# Patient Record
Sex: Female | Born: 1980 | Hispanic: No | Marital: Married | Smoking: Never smoker
Health system: Southern US, Community
[De-identification: ages and names within clinical notes are randomized; demographics above are authoritative.]

## PROBLEM LIST (undated history)

## (undated) HISTORY — PX: OTHER SURGICAL HISTORY: SHX169

---

## 2009-02-19 ENCOUNTER — Inpatient Hospital Stay (HOSPITAL_COMMUNITY): Admission: AD | Admit: 2009-02-19 | Discharge: 2009-02-25 | Payer: Self-pay | Admitting: Obstetrics & Gynecology

## 2009-02-19 ENCOUNTER — Ambulatory Visit: Payer: Self-pay | Admitting: Obstetrics and Gynecology

## 2009-02-19 HISTORY — PX: ABDOMINAL HYSTERECTOMY: SHX81

## 2009-02-19 LAB — HM PAP SMEAR: HM Pap smear: NORMAL

## 2009-02-20 ENCOUNTER — Ambulatory Visit: Payer: Self-pay | Admitting: Pulmonary Disease

## 2009-02-20 ENCOUNTER — Encounter: Payer: Self-pay | Admitting: Obstetrics

## 2009-02-21 ENCOUNTER — Encounter: Payer: Self-pay | Admitting: Internal Medicine

## 2009-02-23 ENCOUNTER — Other Ambulatory Visit: Payer: Self-pay | Admitting: Obstetrics

## 2009-02-24 ENCOUNTER — Other Ambulatory Visit: Payer: Self-pay | Admitting: Obstetrics & Gynecology

## 2009-11-02 ENCOUNTER — Ambulatory Visit: Payer: Self-pay | Admitting: Cardiovascular Disease

## 2009-11-02 DIAGNOSIS — R0602 Shortness of breath: Secondary | ICD-10-CM | POA: Insufficient documentation

## 2009-11-02 DIAGNOSIS — R002 Palpitations: Secondary | ICD-10-CM | POA: Insufficient documentation

## 2009-11-09 ENCOUNTER — Ambulatory Visit: Payer: Self-pay | Admitting: Cardiovascular Disease

## 2010-02-08 ENCOUNTER — Ambulatory Visit: Payer: Self-pay | Admitting: Internal Medicine

## 2010-03-21 NOTE — Letter (Signed)
Summary: PHI  PHI   Imported By: Harlon Flor 11/07/2009 11:51:25  _____________________________________________________________________  External Attachment:    Type:   Image     Comment:   External Document

## 2010-03-21 NOTE — Assessment & Plan Note (Signed)
Summary: NP6/AMD   Visit Type:  Initial Consult Primary Provider:  Dr. Ronna Polio  CC:  c/o decrease in exercise tolerance and palpitations..  History of Present Illness: 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  She reports that her symptoms have started since her delivery of their first child earlier in January. She had a very complicated delivery associated with hemorrhage, possible DIC, requiring transfer to [facility] with intubation and a short stay in the intensive care unit, hysterectomy. Since her recovery, she has had some shortness of breath. This has been noted by her husband when they exhibit themselves. When she climbs stairs, he has noticed this. She has started back at work and works for a full week at a time. She does not notice any symptoms of shortness of breath at work. She denies any lightheadedness, coughing, lower extremity edema.  She has noticed palpitations at times, typically when she is less active. She has not noticed them at work. She feels that when she is playing with the baby. They are not fast, typically she has a regular rate. She is concerned about other arrhythmia though denies any tachycardia palpitations.  EKG shows normal sinus rhythm with rate of 77 beats per minute, no significant ST or T wave changes.  Preventive Screening-Counseling & Management  Alcohol-Tobacco     Smoking Status: never  Caffeine-Diet-Exercise     Does Patient Exercise: no      Drug Use:  no.    Current Medications (verified): 1)  Vitamin 3 50,000 Units .... Once A Week  Allergies (verified): No Known Drug Allergies  Past History:  Family History: Last updated: 11/02/2009 Family History of CVA or Stroke:  Family History of Cancer:  Family History of Hypertension:   Social History: Last updated: 11/02/2009 Married    Full Time Tobacco Use - No.  Alcohol Use - no Regular Exercise - no Drug Use - no  Risk Factors: Exercise: no (11/02/2009)  Risk Factors: Smoking Status: never (11/02/2009)  Past Medical History: hysterectomy; 2011 emergent hysterectomy after c-section complicated hemorrhage, DIC, required intubation and ICU.  Past Surgical History: hysterectomy; 2011 emergent hysterectomy after c-section complicated hemorrhage, DIC, required intubation and ICU.  Family History: Family History of CVA or Stroke:  Family History of Cancer:  Family History of Hypertension:   Social History: Married  Full Time Tobacco Use - No.  Alcohol Use - no Regular Exercise - no Drug Use - no Smoking Status:  never Does Patient Exercise:  no Drug Use:  no  Review of Systems       The patient complains of dyspnea on exertion.  The patient denies fever, weight loss, weight gain, vision loss, decreased hearing, hoarseness, chest pain, syncope, peripheral edema, prolonged cough, abdominal pain, incontinence, muscle weakness, depression, and enlarged lymph nodes.         palpitations  Vital Signs:  Patient profile:   30 year old female Height:      64 inches Weight:      109 pounds BMI:     18.78 Pulse rate:   77 / minute BP sitting:   100 / 62  (left arm) Cuff size:   regular  Vitals Entered By: Bishop Dublin, CMA (November 02, 2009 2:55 PM)  Physical Exam  General:  Well developed, well nourished, in no acute distress. Head:  normocephalic and atraumatic Neck:  Neck supple, no JVD. No masses, thyromegaly or abnormal cervical nodes. Lungs:  Clear bilaterally to auscultation and percussion. Heart:  Non-displaced PMI, chest non-tender; regular rate and rhythm, S1, S2 without murmurs, rubs or gallops. Carotid upstroke normal, no bruit.  Pedals normal pulses. No edema, no varicosities. Abdomen:   abdomen soft and non-tender without masses Msk:  Back normal, normal gait. Muscle strength and tone  normal. Pulses:  pulses normal in all 4 extremities Extremities:  No clubbing or cyanosis. Neurologic:  Alert and oriented x 3. Skin:  Intact without lesions or rashes. Psych:  Normal affect.   Impression & Recommendations:  Problem # 1:  SHORTNESS OF BREATH (ICD-786.05) etiology of her shortness of breath is uncertain. We will order an echocardiogram to rule out postpartum cardiomyopathy. She has no significant signs of heart failure as her lungs are clear and she has no edema.  If her ejection fraction is normal, I will suggest starting back on a regular exercise program. No medications would be needed.  She has mentioned having tachycardia with significant exercise and I have asked her to monitor her her heart rate with mild to moderate exercise. If it seems significantly elevated, we could have her wear a Holter monitor. Additionally if she has palpable tachycardia at rest associated with shortness of breath, had asked her to contact me for a Holter monitor or event monitor.  Orders: Echocardiogram (Echo)  Problem # 2:  PALPITATIONS (ICD-785.1) She does report palpitations though no irregularity to her heart beat.she does not feel that her heart rate is rapid. She is not want to take a beta blocker or other medication for her symptoms as it is not particularly bothersome. Again if she has worsening symptoms, we will order a monitor through a [facility].  Orders: Echocardiogram (Echo)

## 2010-05-07 LAB — CROSSMATCH
ABO/RH(D): O POS
ABO/RH(D): O POS
Antibody Screen: NEGATIVE
Antibody Screen: NEGATIVE

## 2010-05-07 LAB — DIC (DISSEMINATED INTRAVASCULAR COAGULATION)PANEL
D-Dimer, Quant: 3.21 ug/mL-FEU — ABNORMAL HIGH (ref 0.00–0.48)
Fibrinogen: 163 mg/dL — ABNORMAL LOW (ref 204–475)
Fibrinogen: 264 mg/dL (ref 204–475)
Fibrinogen: 319 mg/dL (ref 204–475)
INR: 1.3 (ref 0.00–1.49)
Platelets: 107 10*3/uL — ABNORMAL LOW (ref 150–400)
Platelets: 22 10*3/uL — CL (ref 150–400)
Platelets: 60 10*3/uL — ABNORMAL LOW (ref 150–400)
Platelets: 66 10*3/uL — ABNORMAL LOW (ref 150–400)
Prothrombin Time: 17.6 seconds — ABNORMAL HIGH (ref 11.6–15.2)
Prothrombin Time: 15 seconds (ref 11.6–15.2)
Smear Review: NONE SEEN
Smear Review: NONE SEEN
Smear Review: NONE SEEN
aPTT: 32 seconds (ref 24–37)
aPTT: 35 seconds (ref 24–37)

## 2010-05-07 LAB — CBC
HCT: 29.4 % — ABNORMAL LOW (ref 36.0–46.0)
HCT: 30.6 % — ABNORMAL LOW (ref 36.0–46.0)
HCT: 34.2 % — ABNORMAL LOW (ref 36.0–46.0)
HCT: 34.8 % — ABNORMAL LOW (ref 36.0–46.0)
Hemoglobin: 10.2 g/dL — ABNORMAL LOW (ref 12.0–15.0)
Hemoglobin: 6 g/dL — CL (ref 12.0–15.0)
Hemoglobin: 7.6 g/dL — ABNORMAL LOW (ref 12.0–15.0)
Hemoglobin: 10.3 g/dL — ABNORMAL LOW (ref 12.0–15.0)
Hemoglobin: 10.6 g/dL — ABNORMAL LOW (ref 12.0–15.0)
Hemoglobin: 11.6 g/dL — ABNORMAL LOW (ref 12.0–15.0)
Hemoglobin: 12.1 g/dL (ref 12.0–15.0)
MCHC: 33.1 g/dL (ref 30.0–36.0)
MCHC: 34.8 g/dL (ref 30.0–36.0)
MCHC: 34.9 g/dL (ref 30.0–36.0)
MCHC: 35.2 g/dL (ref 30.0–36.0)
MCHC: 35.5 g/dL (ref 30.0–36.0)
MCHC: 35.5 g/dL (ref 30.0–36.0)
MCV: 87.6 fL (ref 78.0–100.0)
MCV: 87.9 fL (ref 78.0–100.0)
Platelets: 90 10*3/uL — ABNORMAL LOW (ref 150–400)
Platelets: 22 10*3/uL — CL (ref 150–400)
Platelets: 73 10*3/uL — ABNORMAL LOW (ref 150–400)
Platelets: 80 10*3/uL — ABNORMAL LOW (ref 150–400)
Platelets: 81 10*3/uL — ABNORMAL LOW (ref 150–400)
RBC: 2.57 MIL/uL — ABNORMAL LOW (ref 3.87–5.11)
RBC: 3.22 MIL/uL — ABNORMAL LOW (ref 3.87–5.11)
RBC: 3.32 MIL/uL — ABNORMAL LOW (ref 3.87–5.11)
RBC: 3.41 MIL/uL — ABNORMAL LOW (ref 3.87–5.11)
RBC: 3.8 MIL/uL — ABNORMAL LOW (ref 3.87–5.11)
RBC: 3.97 MIL/uL (ref 3.87–5.11)
RDW: 14.1 % (ref 11.5–15.5)
RDW: 15.3 % (ref 11.5–15.5)
RDW: 15.7 % — ABNORMAL HIGH (ref 11.5–15.5)
RDW: 14.8 % (ref 11.5–15.5)
RDW: 14.9 % (ref 11.5–15.5)
RDW: 15 % (ref 11.5–15.5)
RDW: 15.2 % (ref 11.5–15.5)
RDW: 15.8 % — ABNORMAL HIGH (ref 11.5–15.5)
WBC: 16.6 10*3/uL — ABNORMAL HIGH (ref 4.0–10.5)
WBC: 9.2 10*3/uL (ref 4.0–10.5)
WBC: 7.5 10*3/uL (ref 4.0–10.5)
WBC: 7.9 10*3/uL (ref 4.0–10.5)
WBC: 9.5 10*3/uL (ref 4.0–10.5)

## 2010-05-07 LAB — COMPREHENSIVE METABOLIC PANEL
ALT: 20 U/L (ref 0–35)
AST: 29 U/L (ref 0–37)
Albumin: 1.9 g/dL — ABNORMAL LOW (ref 3.5–5.2)
Alkaline Phosphatase: 61 U/L (ref 39–117)
BUN: 3 mg/dL — ABNORMAL LOW (ref 6–23)
CO2: 25 mEq/L (ref 19–32)
CO2: 25 mEq/L (ref 19–32)
Calcium: 7.8 mg/dL — ABNORMAL LOW (ref 8.4–10.5)
Calcium: 8.2 mg/dL — ABNORMAL LOW (ref 8.4–10.5)
GFR calc Af Amer: >60 mL/min (ref >60)
GFR calc non Af Amer: >60 mL/min (ref >60)
Glucose, Bld: 87 mg/dL (ref 70–99)
Sodium: 139 mEq/L (ref 135–145)
Total Protein: 4.1 g/dL — ABNORMAL LOW (ref 6.0–8.3)
Total Protein: 4.6 g/dL — ABNORMAL LOW (ref 6.0–8.3)

## 2010-05-07 LAB — POCT I-STAT 3, ART BLOOD GAS (G3+)
Bicarbonate: 22.6 mEq/L (ref 20.0–24.0)
O2 Saturation: 99 %
Patient temperature: 98
Patient temperature: 99.8
TCO2: 23 mmol/L (ref 0–100)
pH, Arterial: 7.432 — ABNORMAL HIGH (ref 7.350–7.400)
pO2, Arterial: 193 mmHg — ABNORMAL HIGH (ref 80.0–100.0)

## 2010-05-07 LAB — PREPARE PLATELETS

## 2010-05-07 LAB — PREPARE FRESH FROZEN PLASMA

## 2010-05-07 LAB — POCT I-STAT 7, (LYTES, BLD GAS, ICA,H+H)
Calcium, Ion: 0.92 mmol/L — ABNORMAL LOW (ref 1.12–1.32)
O2 Saturation: 100 %
Potassium: 5.1 mEq/L (ref 3.5–5.1)
Sodium: 139 mEq/L (ref 135–145)
TCO2: 22 mmol/L (ref 0–100)
pCO2 arterial: 34.3 mmHg — ABNORMAL LOW (ref 35.0–45.0)

## 2010-05-07 LAB — BASIC METABOLIC PANEL
BUN: 3 mg/dL — ABNORMAL LOW (ref 6–23)
BUN: 3 mg/dL — ABNORMAL LOW (ref 6–23)
CO2: 23 mEq/L (ref 19–32)
CO2: 26 mEq/L (ref 19–32)
Calcium: 7.4 mg/dL — ABNORMAL LOW (ref 8.4–10.5)
Calcium: 7 mg/dL — ABNORMAL LOW (ref 8.4–10.5)
Calcium: 7.8 mg/dL — ABNORMAL LOW (ref 8.4–10.5)
Chloride: 105 mEq/L (ref 96–112)
Creatinine, Ser: 0.42 mg/dL (ref 0.4–1.2)
Creatinine, Ser: 0.55 mg/dL (ref 0.4–1.2)
GFR calc Af Amer: >60 mL/min (ref >60)
GFR calc Af Amer: >60 mL/min (ref >60)
GFR calc non Af Amer: >60 mL/min (ref >60)
GFR calc non Af Amer: >60 mL/min (ref >60)
GFR calc non Af Amer: >60 mL/min (ref >60)
Glucose, Bld: 117 mg/dL — ABNORMAL HIGH (ref 70–99)
Glucose, Bld: 111 mg/dL — ABNORMAL HIGH (ref 70–99)
Potassium: 3.5 mEq/L (ref 3.5–5.1)
Sodium: 136 mEq/L (ref 135–145)
Sodium: 138 mEq/L (ref 135–145)

## 2010-05-07 LAB — HEPATIC FUNCTION PANEL
ALT: 19 U/L (ref 0–35)
AST: 37 U/L (ref 0–37)
AST: 48 U/L — ABNORMAL HIGH (ref 0–37)
Albumin: 1.8 g/dL — ABNORMAL LOW (ref 3.5–5.2)
Albumin: 2 g/dL — ABNORMAL LOW (ref 3.5–5.2)
Alkaline Phosphatase: 40 U/L (ref 39–117)
Alkaline Phosphatase: 41 U/L (ref 39–117)
Total Bilirubin: 1.6 mg/dL — ABNORMAL HIGH (ref 0.3–1.2)
Total Protein: 4 g/dL — ABNORMAL LOW (ref 6.0–8.3)

## 2010-05-07 LAB — PROTIME-INR
INR: 1.23 (ref 0.00–1.49)
INR: 2.18 — ABNORMAL HIGH (ref 0.00–1.49)
Prothrombin Time: 15.4 seconds — ABNORMAL HIGH (ref 11.6–15.2)
Prothrombin Time: 24.1 seconds — ABNORMAL HIGH (ref 11.6–15.2)

## 2010-05-07 LAB — GLUCOSE, CAPILLARY
Glucose-Capillary: 71 mg/dL (ref 70–99)
Glucose-Capillary: 76 mg/dL (ref 70–99)

## 2010-05-07 LAB — MAGNESIUM: Magnesium: 2 mg/dL (ref 1.5–2.5)

## 2010-05-07 LAB — BRAIN NATRIURETIC PEPTIDE: Pro B Natriuretic peptide (BNP): 124 pg/mL — ABNORMAL HIGH (ref 0.0–100.0)

## 2010-05-07 LAB — APTT: aPTT: 28 seconds (ref 24–37)

## 2010-05-07 LAB — FIBRINOGEN
Fibrinogen: 323 mg/dL (ref 204–475)
Fibrinogen: <60 mg/dL — CL (ref 204–475)

## 2010-09-05 ENCOUNTER — Encounter: Payer: Self-pay | Admitting: Cardiovascular Disease

## 2011-05-15 ENCOUNTER — Ambulatory Visit (INDEPENDENT_AMBULATORY_CARE_PROVIDER_SITE_OTHER): Payer: 59 | Admitting: Internal Medicine

## 2011-05-15 ENCOUNTER — Encounter: Payer: Self-pay | Admitting: Internal Medicine

## 2011-05-15 VITALS — BP 100/62 | HR 102 | Temp 98.1°F | Ht 63.5 in | Wt 107.0 lb

## 2011-05-15 DIAGNOSIS — Z0000 Encounter for general adult medical examination without abnormal findings: Secondary | ICD-10-CM

## 2011-05-15 NOTE — Progress Notes (Signed)
  Subjective:    Patient ID: Emma Bates, female    DOB: [birth year], 31 y.o.   MRN: [id number]  HPI 30YO female presents for annual physical exam. Doing well. No complaints today. Follows healthy diet. 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 Husband works from home.  No outpatient encounter prescriptions on file as of 05/15/2011.    Review of Systems  Constitutional: Negative for fever, chills, appetite change, fatigue and unexpected weight change.  HENT: Negative for ear pain, congestion, sore throat, trouble swallowing, neck pain, voice change and sinus pressure.   Eyes: Negative for visual disturbance.  Respiratory: Negative for cough, shortness of breath, wheezing and stridor.   Cardiovascular: Negative for chest pain, palpitations and leg swelling.  Gastrointestinal: Negative for nausea, vomiting, abdominal pain, diarrhea, constipation, blood in stool, abdominal distention and anal bleeding.  Genitourinary: Negative for dysuria and flank pain.  Musculoskeletal: Negative for myalgias, arthralgias and gait problem.  Skin: Negative for color change and rash.  Neurological: Negative for dizziness and headaches.  Hematological: Negative for adenopathy. Does not bruise/bleed easily.  Psychiatric/Behavioral: Negative for suicidal ideas, sleep disturbance and dysphoric mood. The patient is not nervous/anxious.    BP 100/62  Pulse 102  Temp(Src) 98.1 F (36.7 C) (Oral)  Ht 5' 3.5" (1.613 m)  Wt 107 lb (48.535 kg)  BMI 18.66 kg/m2  SpO2 100%     Objective:   Physical Exam  Constitutional: She is oriented to person, place, and time. She appears well-developed and well-nourished. No distress.  HENT:  Head: Normocephalic and atraumatic.  Right Ear: External ear normal.  Left Ear: External ear normal.  Nose: Nose normal.  Mouth/Throat: Oropharynx is clear and moist. No oropharyngeal exudate.  Eyes: Conjunctivae are normal. Pupils are equal, round, and reactive to light.  Right eye exhibits no discharge. Left eye exhibits no discharge. No scleral icterus.  Neck: Normal range of motion. Neck supple. No tracheal deviation present. No thyromegaly present.  Cardiovascular: Normal rate, regular rhythm, normal heart sounds and intact distal pulses.  Exam reveals no gallop and no friction rub.   No murmur heard. Pulmonary/Chest: Effort normal and breath sounds normal. No respiratory distress. She has no wheezes. She has no rales. She exhibits no tenderness.  Abdominal: Soft. Bowel sounds are normal. She exhibits no distension. There is no tenderness.  Musculoskeletal: Normal range of motion. She exhibits no edema and no tenderness.  Lymphadenopathy:    She has no cervical adenopathy.  Neurological: She is alert and oriented to person, place, and time. No cranial nerve deficit. She exhibits normal muscle tone. Coordination normal.  Skin: Skin is warm and dry. No rash noted. She is not diaphoretic. No erythema. No pallor.  Psychiatric: She has a normal mood and affect. Her behavior is normal. Judgment and thought content normal.          Assessment & Plan:

## 2011-05-15 NOTE — Assessment & Plan Note (Signed)
Doing very well. Normal BMI. Active, follows healthy diet. PAP deferred as s/p complete hysterectomy with removal of cervix.  Immunizations UTD. Will check basic labs including CBC, CMP, lipids, Vit D, A1c. Follow up 1 year and prn.

## 2011-05-16 ENCOUNTER — Other Ambulatory Visit (INDEPENDENT_AMBULATORY_CARE_PROVIDER_SITE_OTHER): Payer: 59 | Admitting: *Deleted

## 2011-05-16 ENCOUNTER — Encounter: Payer: Self-pay | Admitting: Cardiovascular Disease

## 2011-05-16 DIAGNOSIS — Z0000 Encounter for general adult medical examination without abnormal findings: Secondary | ICD-10-CM

## 2011-05-16 LAB — CBC WITH DIFFERENTIAL/PLATELET
Basophils Relative: 0.3 % (ref 0.0–3.0)
Eosinophils Absolute: 0 10*3/uL (ref 0.0–0.7)
Eosinophils Relative: 0.7 % (ref 0.0–5.0)
Hemoglobin: 13.7 g/dL (ref 12.0–15.0)
Lymphocytes Relative: 29.3 % (ref 12.0–46.0)
MCHC: 33.6 g/dL (ref 30.0–36.0)
Monocytes Relative: 7.8 % (ref 3.0–12.0)
Neutro Abs: 2.9 10*3/uL (ref 1.4–7.7)
Neutrophils Relative %: 61.9 % (ref 43.0–77.0)
RBC: 4.73 Mil/uL (ref 3.87–5.11)
WBC: 4.6 10*3/uL (ref 4.5–10.5)

## 2011-05-16 LAB — COMPREHENSIVE METABOLIC PANEL
AST: 19 U/L (ref 0–37)
Albumin: 4.1 g/dL (ref 3.5–5.2)
BUN: 10 mg/dL (ref 6–23)
CO2: 29 mEq/L (ref 19–32)
Calcium: 9.2 mg/dL (ref 8.4–10.5)
Chloride: 103 mEq/L (ref 96–112)
Creatinine, Ser: 0.5 mg/dL (ref 0.4–1.2)
GFR: 152.19 mL/min (ref >60.00)
Glucose, Bld: 91 mg/dL (ref 70–99)
Potassium: 4.1 mEq/L (ref 3.5–5.1)

## 2011-05-16 LAB — LIPID PANEL
Cholesterol: 156 mg/dL (ref 0–200)
LDL Cholesterol: 84 mg/dL (ref 0–99)
Triglycerides: 46 mg/dL (ref 0.0–149.0)

## 2011-05-17 ENCOUNTER — Encounter: Payer: Self-pay | Admitting: Internal Medicine

## 2011-05-17 LAB — VITAMIN D 25 HYDROXY (VIT D DEFICIENCY, FRACTURES): Vit D, 25-Hydroxy: 26 ng/mL — ABNORMAL LOW (ref 30–89)

## 2011-10-01 ENCOUNTER — Other Ambulatory Visit: Payer: Self-pay | Admitting: Internal Medicine

## 2011-10-01 MED ORDER — POLYMYXIN B-TRIMETHOPRIM 10000-0.1 UNIT/ML-% OP SOLN: 1.0000 [drp] | OPHTHALMIC | Status: AC

## 2011-10-29 ENCOUNTER — Encounter: Payer: Self-pay | Admitting: Internal Medicine

## 2011-11-14 ENCOUNTER — Encounter: Payer: Self-pay | Admitting: Internal Medicine

## 2011-11-14 ENCOUNTER — Ambulatory Visit (INDEPENDENT_AMBULATORY_CARE_PROVIDER_SITE_OTHER): Payer: 59 | Admitting: Internal Medicine

## 2011-11-14 VITALS — BP 104/68 | HR 68 | Resp 14 | Wt 110.5 lb

## 2011-11-14 DIAGNOSIS — Z139 Encounter for screening, unspecified: Secondary | ICD-10-CM

## 2011-11-14 DIAGNOSIS — Z13220 Encounter for screening for lipoid disorders: Secondary | ICD-10-CM

## 2011-11-14 LAB — HEMOGLOBIN A1C: Hgb A1c MFr Bld: 5.3 % (ref 4.6–6.5)

## 2011-11-14 NOTE — Patient Instructions (Addendum)
It was nice meeting you today.  We will complete the work form once the lab results are complete.  We will notify you of your lab results.

## 2011-11-14 NOTE — Assessment & Plan Note (Signed)
Here to have a work form completed.  Required labs ordered - LDL, fasting glucose and a1c.  Once lab results return, will complete form and notify pt.

## 2011-11-14 NOTE — Progress Notes (Signed)
  Subjective:    Patient ID: Emma Bates, female    DOB: March 24, 1980, 31 y.o.   MRN: [id number]  HPI  31 year old female who presents to have a work form completed.   Outpatient Prescriptions Prior to Visit  Medication Sig Dispense Refill  . Cholecalciferol (VITAMIN D3) 50000 UNITS CAPS Take by mouth once a week.          Review of Systems  HENT: Negative for congestion.   Respiratory: Negative for cough, chest tightness and shortness of breath.   Cardiovascular: Negative for chest pain.  Gastrointestinal: Negative for nausea, vomiting, abdominal pain, diarrhea and constipation.  Neurological: Negative for weakness.       Objective:   Physical Exam  Constitutional: She appears well-developed and well-nourished. No distress.  HENT:  Nose: Nose normal.  Mouth/Throat: Oropharynx is clear and moist.       TMs visualized - without erythema.    Neck: Neck supple.       nontender  Cardiovascular: Normal rate, regular rhythm and normal heart sounds.   Pulmonary/Chest: Breath sounds normal. No respiratory distress. She has no wheezes.  Abdominal: Soft. Bowel sounds are normal. There is no tenderness.  Musculoskeletal: She exhibits no edema.       No pain with straight leg raise.   Lymphadenopathy:    She has no cervical adenopathy.          Assessment & Plan:

## 2011-11-15 ENCOUNTER — Telehealth: Payer: Self-pay | Admitting: Internal Medicine

## 2011-11-15 NOTE — Telephone Encounter (Signed)
Patient advised as instructed via telephone, form faxed to [phone].

## 2011-11-15 NOTE — Telephone Encounter (Signed)
Labs ok.  Form completed

## 2011-12-24 ENCOUNTER — Telehealth: Payer: Self-pay | Admitting: Internal Medicine

## 2011-12-24 NOTE — Telephone Encounter (Signed)
Pt called Wanting to know why her labs were not on my chart

## 2011-12-25 NOTE — Telephone Encounter (Signed)
Patient's lab results released in my chart.

## 2011-12-27 ENCOUNTER — Encounter: Payer: Self-pay | Admitting: *Deleted

## 2012-04-05 ENCOUNTER — Other Ambulatory Visit: Payer: Self-pay

## 2012-10-13 ENCOUNTER — Encounter: Payer: Self-pay | Admitting: Adult Health

## 2012-10-13 ENCOUNTER — Ambulatory Visit (INDEPENDENT_AMBULATORY_CARE_PROVIDER_SITE_OTHER): Payer: 59 | Admitting: Adult Health

## 2012-10-13 VITALS — BP 96/72 | HR 76 | Temp 98.0°F | Resp 12 | Ht 64.0 in | Wt 109.0 lb

## 2012-10-13 DIAGNOSIS — Z0000 Encounter for general adult medical examination without abnormal findings: Secondary | ICD-10-CM

## 2012-10-13 DIAGNOSIS — E559 Vitamin D deficiency, unspecified: Secondary | ICD-10-CM

## 2012-10-13 NOTE — Progress Notes (Signed)
Subjective:    Patient ID: Emma Bates, female    DOB: 1981-02-05, 32 y.o.   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 She is doing very well. She works as a Licensed conveyancer at [employer].   PMHx: No past medical hx.   Past Surgical History  Procedure Laterality Date  . Hysterectomy (other)      2011 emergent hysterectomy afte rc-sectio complicated hemorrhage   . Dic      required intubation an dICU  . Abdominal hysterectomy  2011  . Cesarean section  2011    Family History  Problem Relation Age of Onset  . Cancer Other   . Stroke Other   . Hypertension Other   . Diabetes Mother   . Thyroid disease Father   . Cancer Maternal Grandmother     Ovarian - 60's  . Hypertension Maternal Grandmother   . Stroke Paternal Grandfather   . Hypertension Paternal Grandfather     History   Social History  . Marital Status: Married    Spouse Name: N/A    Number of Children: 1  . Years of Education: N/A   Occupational History  . MD     Social History Main Topics  . Smoking status: Never Smoker   . Smokeless tobacco: Never Used     Comment: tobacco use- no   . Alcohol Use: No  . Drug Use: No  . Sexual Activity:    Other Topics Concern  . Not on file   Social History Narrative   ** Merged History Encounter **       ** Data from: 09/05/10 Enc Dept: [facility]   Full time. Does not regularly exercise.        ** Data from: 05/15/11 Enc Dept: [facility]   Regular Exercise -  GYM, treadmill or elliptical 2 times a week x 30 min   Daily Caffeine Use:  Occasional soda           Review of Systems  Constitutional: Negative.   HENT: Negative.   Eyes: Negative.   Respiratory: Negative.   Cardiovascular: Negative.   Gastrointestinal: Negative.   Endocrine: Negative.   Genitourinary: Negative.   Musculoskeletal: Negative.   Skin: Negative.   Allergic/Immunologic: Negative.    Neurological: Negative.   Hematological: Negative.   Psychiatric/Behavioral: Negative.     BP 96/72  Pulse 76  Temp(Src) 98 F (36.7 C) (Oral)  Resp 12  Ht 5\' 4"  (1.626 m)  Wt 109 lb (49.442 kg)  BMI 18.7 kg/m2  SpO2 99%    Objective:   Physical Exam  Constitutional: She is oriented to person, place, and time. She appears well-developed and well-nourished.  HENT:  Head: Normocephalic and atraumatic.  Right Ear: External ear normal.  Left Ear: External ear normal.  Nose: Nose normal.  Mouth/Throat: Oropharynx is clear and moist.  Eyes: Conjunctivae and EOM are normal. Pupils are equal, round, and reactive to light.  Neck: Normal range of motion. Neck supple. No tracheal deviation present. No thyromegaly present.  Cardiovascular: Normal rate, regular rhythm, normal heart sounds and intact distal pulses.  Exam reveals no gallop and no friction rub.   No murmur heard. Pulmonary/Chest: Effort normal and breath sounds normal. No respiratory distress. She has no wheezes. She has no rales.  Abdominal: Soft. Bowel sounds are normal. She exhibits no distension and no mass. There is no tenderness. There is no rebound and no  guarding.  Genitourinary:  Pelvic deferred - s/p hysterectomy  Musculoskeletal: Normal range of motion. She exhibits no edema and no tenderness.  Lymphadenopathy:    She has no cervical adenopathy.  Neurological: She is alert and oriented to person, place, and time. She has normal reflexes. No cranial nerve deficit. Coordination normal.  Skin: Skin is warm and dry.  Psychiatric: She has a normal mood and affect. Her behavior is normal. Judgment and thought content normal.          Assessment & Plan:

## 2012-10-13 NOTE — Assessment & Plan Note (Signed)
Normal physical exam excluding pelvic as s/p complete hysterectomy with removal of cervix. Normal BMI. Immunizations up to date. Check labs: CBC, CMP, lipids, Vit D, A1c. She will return for fasting labs. Follow up 1 year and prn.

## 2012-10-14 ENCOUNTER — Other Ambulatory Visit (INDEPENDENT_AMBULATORY_CARE_PROVIDER_SITE_OTHER): Payer: 59

## 2012-10-14 DIAGNOSIS — Z0000 Encounter for general adult medical examination without abnormal findings: Secondary | ICD-10-CM

## 2012-10-14 DIAGNOSIS — E559 Vitamin D deficiency, unspecified: Secondary | ICD-10-CM

## 2012-10-14 LAB — CBC WITH DIFFERENTIAL/PLATELET
Eosinophils Relative: 0.9 % (ref 0.0–5.0)
HCT: 37.5 % (ref 36.0–46.0)
Hemoglobin: 12.8 g/dL (ref 12.0–15.0)
Lymphs Abs: 1.1 10*3/uL (ref 0.7–4.0)
Monocytes Relative: 8.8 % (ref 3.0–12.0)
Neutro Abs: 3 10*3/uL (ref 1.4–7.7)
RBC: 4.4 Mil/uL (ref 3.87–5.11)
WBC: 4.5 10*3/uL (ref 4.5–10.5)

## 2012-10-14 LAB — COMPREHENSIVE METABOLIC PANEL
Albumin: 3.9 g/dL (ref 3.5–5.2)
BUN: 9 mg/dL (ref 6–23)
Calcium: 9 mg/dL (ref 8.4–10.5)
Chloride: 106 mEq/L (ref 96–112)
Glucose, Bld: 88 mg/dL (ref 70–99)
Potassium: 4 mEq/L (ref 3.5–5.1)
Sodium: 136 mEq/L (ref 135–145)
Total Protein: 6.9 g/dL (ref 6.0–8.3)

## 2012-10-14 LAB — LIPID PANEL
Cholesterol: 138 mg/dL (ref 0–200)
Triglycerides: 60 mg/dL (ref 0.0–149.0)

## 2012-10-15 ENCOUNTER — Encounter: Payer: Self-pay | Admitting: Adult Health

## 2012-10-15 ENCOUNTER — Other Ambulatory Visit: Payer: Self-pay | Admitting: Adult Health

## 2012-10-15 LAB — VITAMIN D 25 HYDROXY (VIT D DEFICIENCY, FRACTURES): Vit D, 25-Hydroxy: 18 ng/mL — ABNORMAL LOW (ref 30–89)

## 2012-10-15 MED ORDER — ERGOCALCIFEROL 1.25 MG (50000 UT) PO CAPS: 50000.0000 [IU] | ORAL_CAPSULE | ORAL | Status: DC

## 2012-12-25 ENCOUNTER — Other Ambulatory Visit: Payer: Self-pay

## 2013-01-05 ENCOUNTER — Other Ambulatory Visit: Payer: Self-pay | Admitting: Adult Health

## 2013-01-05 NOTE — Telephone Encounter (Signed)
Sent myChart message, notifying pt of need for repeat Vitamin D level.

## 2013-11-12 ENCOUNTER — Encounter: Payer: Self-pay | Admitting: Internal Medicine

## 2013-11-12 ENCOUNTER — Ambulatory Visit (INDEPENDENT_AMBULATORY_CARE_PROVIDER_SITE_OTHER): Payer: 59 | Admitting: Internal Medicine

## 2013-11-12 VITALS — BP 98/62 | HR 63 | Temp 98.0°F | Ht 63.75 in | Wt 106.0 lb

## 2013-11-12 DIAGNOSIS — Z0000 Encounter for general adult medical examination without abnormal findings: Secondary | ICD-10-CM

## 2013-11-12 LAB — COMPREHENSIVE METABOLIC PANEL
ALT: 39 U/L — ABNORMAL HIGH (ref 0–35)
AST: 42 U/L — AB (ref 0–37)
Albumin: 4.5 g/dL (ref 3.5–5.2)
Alkaline Phosphatase: 68 U/L (ref 39–117)
BUN: 12 mg/dL (ref 6–23)
CALCIUM: 9.4 mg/dL (ref 8.4–10.5)
CHLORIDE: 103 meq/L (ref 96–112)
CO2: 28 meq/L (ref 19–32)
CREATININE: 0.6 mg/dL (ref 0.4–1.2)
GFR: 116.92 mL/min (ref >60.00)
Glucose, Bld: 95 mg/dL (ref 70–99)
Potassium: 5.1 mEq/L (ref 3.5–5.1)
Sodium: 138 mEq/L (ref 135–145)
Total Bilirubin: 0.6 mg/dL (ref 0.2–1.2)
Total Protein: 7 g/dL (ref 6.0–8.3)

## 2013-11-12 LAB — CBC WITH DIFFERENTIAL/PLATELET
BASOS PCT: 0.4 % (ref 0.0–3.0)
Basophils Absolute: 0 10*3/uL (ref 0.0–0.1)
EOS ABS: 0 10*3/uL (ref 0.0–0.7)
Eosinophils Relative: 0.9 % (ref 0.0–5.0)
HEMATOCRIT: 41.2 % (ref 36.0–46.0)
HEMOGLOBIN: 13.9 g/dL (ref 12.0–15.0)
LYMPHS PCT: 30.6 % (ref 12.0–46.0)
Lymphs Abs: 1.5 10*3/uL (ref 0.7–4.0)
MCHC: 33.8 g/dL (ref 30.0–36.0)
MCV: 84.8 fl (ref 78.0–100.0)
Monocytes Absolute: 0.3 10*3/uL (ref 0.1–1.0)
Monocytes Relative: 6.4 % (ref 3.0–12.0)
NEUTROS ABS: 3 10*3/uL (ref 1.4–7.7)
Neutrophils Relative %: 61.7 % (ref 43.0–77.0)
Platelets: 143 10*3/uL — ABNORMAL LOW (ref 150.0–400.0)
RBC: 4.86 Mil/uL (ref 3.87–5.11)
RDW: 13 % (ref 11.5–15.5)
WBC: 4.8 10*3/uL (ref 4.0–10.5)

## 2013-11-12 LAB — LIPID PANEL
Cholesterol: 167 mg/dL (ref 0–200)
HDL: 65.6 mg/dL (ref >39.00)
LDL Cholesterol: 89 mg/dL (ref 0–99)
NonHDL: 101.4
TRIGLYCERIDES: 63 mg/dL (ref 0.0–149.0)
Total CHOL/HDL Ratio: 3
VLDL: 12.6 mg/dL (ref 0.0–40.0)

## 2013-11-12 LAB — VITAMIN B12: VITAMIN B 12: 279 pg/mL (ref 211–911)

## 2013-11-12 LAB — HEMOGLOBIN A1C: HEMOGLOBIN A1C: 5.4 % (ref 4.6–6.5)

## 2013-11-12 LAB — MICROALBUMIN / CREATININE URINE RATIO
Creatinine,U: 197.2 mg/dL
MICROALB/CREAT RATIO: 0.6 mg/g (ref 0.0–30.0)
Microalb, Ur: 1.1 mg/dL (ref 0.0–1.9)

## 2013-11-12 LAB — VITAMIN D 25 HYDROXY (VIT D DEFICIENCY, FRACTURES): VITD: 20.21 ng/mL — AB (ref 30.00–100.00)

## 2013-11-12 LAB — TSH: TSH: 1.58 u[IU]/mL (ref 0.35–4.50)

## 2013-11-12 NOTE — Assessment & Plan Note (Signed)
General medical exam including breast exam normal today. PAP and pelvic deferred as pt s/p complete hysterectomy. Immunizations are UTD. Labs today including CBC, CMP, lipids, A1c, TSH, Vit D. Encouraged healthy diet and exercise.

## 2013-11-12 NOTE — Patient Instructions (Signed)

## 2013-11-12 NOTE — Progress Notes (Signed)
Subjective:    Patient ID: Emma Bates, female    DOB: 05-01-1980, 33 y.o.   MRN: [id number]  HPI 33YO female presents for annual exam. Feeling well. No concerns today. Follows healthy, vegetarian diet. Physically active.   Review of Systems  Constitutional: Negative for fever, chills, appetite change, fatigue and unexpected weight change.  Eyes: Negative for visual disturbance.  Respiratory: Negative for shortness of breath.   Cardiovascular: Negative for chest pain and leg swelling.  Gastrointestinal: Negative for nausea, vomiting, abdominal pain, diarrhea and constipation.  Musculoskeletal: Negative for arthralgias and myalgias.  Skin: Negative for color change and rash.  Hematological: Negative for adenopathy. Does not bruise/bleed easily.  Psychiatric/Behavioral: Negative for dysphoric mood. The patient is not nervous/anxious.        Objective:    BP 98/62  Pulse 63  Temp(Src) 98 F (36.7 C) (Oral)  Ht 5' 3.75" (1.619 m)  Wt 106 lb (48.081 kg)  BMI 18.34 kg/m2  SpO2 99% Physical Exam  Constitutional: She is oriented to person, place, and time. She appears well-developed and well-nourished. No distress.  HENT:  Head: Normocephalic and atraumatic.  Right Ear: External ear normal.  Left Ear: External ear normal.  Nose: Nose normal.  Mouth/Throat: Oropharynx is clear and moist. No oropharyngeal exudate.  Eyes: Conjunctivae are normal. Pupils are equal, round, and reactive to light. Right eye exhibits no discharge. Left eye exhibits no discharge. No scleral icterus.  Neck: Normal range of motion. Neck supple. No tracheal deviation present. No thyromegaly present.  Cardiovascular: Normal rate, regular rhythm, normal heart sounds and intact distal pulses.  Exam reveals no gallop and no friction rub.   No murmur heard. Pulmonary/Chest: Effort normal and breath sounds normal. No accessory muscle usage. Not tachypneic. No respiratory distress. She has no decreased  breath sounds. She has no wheezes. She has no rhonchi. She has no rales. She exhibits no tenderness. Right breast exhibits no inverted nipple, no mass, no nipple discharge, no skin change and no tenderness. Left breast exhibits no inverted nipple, no mass, no nipple discharge, no skin change and no tenderness. Breasts are symmetrical.  Abdominal: Soft. Bowel sounds are normal. She exhibits no distension and no mass. There is no tenderness. There is no rebound and no guarding.  Musculoskeletal: Normal range of motion. She exhibits no edema and no tenderness.  Lymphadenopathy:    She has no cervical adenopathy.  Neurological: She is alert and oriented to person, place, and time. No cranial nerve deficit. She exhibits normal muscle tone. Coordination normal.  Skin: Skin is warm and dry. No rash noted. She is not diaphoretic. No erythema. No pallor.  Psychiatric: She has a normal mood and affect. Her behavior is normal. Judgment and thought content normal.          Assessment & Plan:   Problem List Items Addressed This Visit     Unprioritized   Routine general medical examination at a health care facility - Primary     General medical exam including breast exam normal today. PAP and pelvic deferred as pt s/p complete hysterectomy. Immunizations are UTD. Labs today including CBC, CMP, lipids, A1c, TSH, Vit D. Encouraged healthy diet and exercise.    Relevant Orders      CBC with Differential      Comprehensive metabolic panel      Lipid panel      Microalbumin / creatinine urine ratio      Vit D  25 hydroxy (rtn osteoporosis monitoring)  TSH      HgB A1c      B12       No Follow-up on file.

## 2013-11-19 ENCOUNTER — Other Ambulatory Visit (INDEPENDENT_AMBULATORY_CARE_PROVIDER_SITE_OTHER): Payer: 59

## 2013-11-19 ENCOUNTER — Telehealth: Payer: Self-pay | Admitting: *Deleted

## 2013-11-19 DIAGNOSIS — R7989 Other specified abnormal findings of blood chemistry: Secondary | ICD-10-CM

## 2013-11-19 LAB — COMPREHENSIVE METABOLIC PANEL
ALK PHOS: 55 U/L (ref 39–117)
ALT: 20 U/L (ref 0–35)
AST: 22 U/L (ref 0–37)
Albumin: 4 g/dL (ref 3.5–5.2)
BILIRUBIN TOTAL: 0.8 mg/dL (ref 0.2–1.2)
BUN: 9 mg/dL (ref 6–23)
CO2: 28 meq/L (ref 19–32)
CREATININE: 0.5 mg/dL (ref 0.4–1.2)
Calcium: 8.9 mg/dL (ref 8.4–10.5)
Chloride: 106 mEq/L (ref 96–112)
GFR: 157.08 mL/min (ref >60.00)
Glucose, Bld: 90 mg/dL (ref 70–99)
Potassium: 4.2 mEq/L (ref 3.5–5.1)
SODIUM: 137 meq/L (ref 135–145)
Total Protein: 6.5 g/dL (ref 6.0–8.3)

## 2013-11-19 NOTE — Telephone Encounter (Signed)
What labs and dx?  

## 2013-11-19 NOTE — Telephone Encounter (Signed)
CMP for elevated liver function tests.

## 2014-04-26 ENCOUNTER — Telehealth: Payer: Self-pay

## 2014-04-26 NOTE — Telephone Encounter (Signed)
The patient called back and has scheduled for tomorrow with C.Doss.

## 2014-04-26 NOTE — Telephone Encounter (Signed)
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  She was offered an appointment for tomorrow, but refused stating "she can usually be worked in same day".  Please advise if you want this patient on your schedule for today.

## 2014-04-26 NOTE — Telephone Encounter (Signed)
Please let her know I can see her this afternoon at 3:45pm or 4pm?

## 2014-04-27 ENCOUNTER — Encounter: Payer: Self-pay | Admitting: Nurse Practitioner

## 2014-04-27 ENCOUNTER — Ambulatory Visit (INDEPENDENT_AMBULATORY_CARE_PROVIDER_SITE_OTHER): Payer: 59 | Admitting: Nurse Practitioner

## 2014-04-27 VITALS — BP 98/64 | HR 68 | Temp 97.8°F | Resp 12 | Ht 63.75 in | Wt 110.0 lb

## 2014-04-27 DIAGNOSIS — J069 Acute upper respiratory infection, unspecified: Secondary | ICD-10-CM | POA: Insufficient documentation

## 2014-04-27 DIAGNOSIS — R509 Fever, unspecified: Secondary | ICD-10-CM

## 2014-04-27 DIAGNOSIS — B9789 Other viral agents as the cause of diseases classified elsewhere: Secondary | ICD-10-CM

## 2014-04-27 LAB — POCT INFLUENZA A/B
Influenza A, POC: NEGATIVE
Influenza B, POC: NEGATIVE

## 2014-04-27 NOTE — Progress Notes (Signed)
   Subjective:    Patient ID: Emma Bates, female    DOB: 19-Dec-1980, 34 y.o.   MRN: [id number]  HPI  Emma Bates is a 34 yo female with a CC of cough and sore throat with fever.  1) Non productive cough, sore throat, midnight 101.8, chills, sweats, 1 week ago nauseated and vomiting. 100.8 yesterday morning. Motrin/Tylenol- helpful  Daughter woke up this morning with a fever of 103. She would like to rule out influenza.     Review of Systems  Constitutional: Positive for fever, chills, diaphoresis and fatigue.  Respiratory: Positive for cough. Negative for chest tightness, shortness of breath and wheezing.   Cardiovascular: Negative for chest pain, palpitations and leg swelling.  Gastrointestinal: Positive for nausea and vomiting. Negative for diarrhea.       Last week N/V currently resolved.   Skin: Negative for rash.  Neurological: Negative for dizziness, weakness, numbness and headaches.  Psychiatric/Behavioral: The patient is not nervous/anxious.        Objective:   Physical Exam  Constitutional: She is oriented to person, place, and time. She appears well-developed and well-nourished. No distress.  BP 98/64 mmHg  Pulse 68  Temp(Src) 97.8 F (36.6 C) (Oral)  Resp 12  Ht 5' 3.75" (1.619 m)  Wt 110 lb (49.896 kg)  BMI 19.04 kg/m2  SpO2 99%   HENT:  Head: Normocephalic and atraumatic.  Right Ear: External ear normal.  Left Ear: External ear normal.  Eyes: EOM are normal. Pupils are equal, round, and reactive to light. Right eye exhibits no discharge. Left eye exhibits no discharge. No scleral icterus.  Neck: Normal range of motion. Neck supple. No thyromegaly present.  Cardiovascular: Normal rate, regular rhythm, normal heart sounds and intact distal pulses.  Exam reveals no gallop and no friction rub.   No murmur heard. Pulmonary/Chest: Effort normal and breath sounds normal. No respiratory distress. She has no wheezes. She has no rales. She exhibits no  tenderness.  Lymphadenopathy:    She has no cervical adenopathy.  Neurological: She is alert and oriented to person, place, and time. No cranial nerve deficit. She exhibits normal muscle tone. Coordination normal.  Skin: Skin is warm and dry. No rash noted. She is not diaphoretic.  Psychiatric: She has a normal mood and affect. Her behavior is normal. Judgment and thought content normal.      Assessment & Plan:

## 2014-04-27 NOTE — Patient Instructions (Signed)
Let me know if you need anything!     Viral Infections A virus is a type of germ. Viruses can cause:  Minor sore throats.  Aches and pains.  Headaches.  Runny nose.  Rashes.  Watery eyes.  Tiredness.  Coughs.  Loss of appetite.  Feeling sick to your stomach (nausea).  Throwing up (vomiting).  Watery poop (diarrhea). HOME CARE   Only take medicines as told by your doctor.  Drink enough water and fluids to keep your pee (urine) clear or pale yellow. Sports drinks are a good choice.  Get plenty of rest and eat healthy. Soups and broths with crackers or rice are fine. GET HELP RIGHT AWAY IF:   You have a very bad headache.  You have shortness of breath.  You have chest pain or neck pain.  You have an unusual rash.  You cannot stop throwing up.  You have watery poop that does not stop.  You cannot keep fluids down.  You or your child has a temperature by mouth above 102 F (38.9 C), not controlled by medicine.  Your baby is older than 3 months with a rectal temperature of 102 F (38.9 C) or higher.  Your baby is 513 months old or younger with a rectal temperature of 100.4 F (38 C) or higher. MAKE SURE YOU:   Understand these instructions.  Will watch this condition.  Will get help right away if you are not doing well or get worse. Document Released: 01/19/2008 Document Revised: 04/30/2011 Document Reviewed: 06/13/2010 [facility] Patient Information 2015 [employer] [place]. This information is not intended to replace advice given to you by your health care provider. Make sure you discuss any questions you have with your health care provider.

## 2014-04-27 NOTE — Assessment & Plan Note (Signed)
Improved. Pt would like to be tested for flu in case her daughter has been exposed and could start tx. POCT influenza A/B is negative. Will continue OTC treatment and call us if at day 10 she is not improving.

## 2014-04-27 NOTE — Progress Notes (Signed)
Pre visit review using our clinic review tool, if applicable. No additional management support is needed unless otherwise documented below in the visit note. 

## 2014-10-19 ENCOUNTER — Encounter: Payer: 59 | Admitting: Internal Medicine

## 2014-11-01 ENCOUNTER — Encounter: Payer: Self-pay | Admitting: Internal Medicine

## 2014-11-01 ENCOUNTER — Ambulatory Visit (INDEPENDENT_AMBULATORY_CARE_PROVIDER_SITE_OTHER): Payer: 59 | Admitting: Internal Medicine

## 2014-11-01 VITALS — BP 101/69 | HR 70 | Temp 97.5°F | Ht 63.5 in | Wt 110.0 lb

## 2014-11-01 DIAGNOSIS — Z0000 Encounter for general adult medical examination without abnormal findings: Secondary | ICD-10-CM

## 2014-11-01 DIAGNOSIS — Z23 Encounter for immunization: Secondary | ICD-10-CM

## 2014-11-01 LAB — HM PAP SMEAR

## 2014-11-01 NOTE — Progress Notes (Signed)
Subjective:    Patient ID: Emma Bates, female    DOB: 04/24/1980, 34 y.o.   MRN: [id number]  HPI  34YO female presents for physical exam.  Feeling well. No concerns today. Recently traveled to [place] to visit family. Continues to work as hospitalist.   BP Readings from Last 3 Encounters:  11/01/14 101/69  04/27/14 98/64  11/12/13 98/62   Wt Readings from Last 3 Encounters:  11/01/14 110 lb (49.896 kg)  04/27/14 110 lb (49.896 kg)  11/12/13 106 lb (48.081 kg)     History reviewed. No pertinent past medical history. Family History  Problem Relation Age of Onset  . Cancer Other   . Stroke Other   . Hypertension Other   . Diabetes Mother   . Hypertension Mother   . Hypertension Maternal Grandmother   . Stroke Paternal Grandfather   . Hypertension Paternal Grandfather   . Thyroid disease Sister   . Cancer Paternal Grandmother     ovarian    Past Surgical History  Procedure Laterality Date  . Hysterectomy (other)      2011 emergent hysterectomy afte rc-sectio complicated hemorrhage   . Dic      required intubation an dICU  . Abdominal hysterectomy  2011  . Cesarean section  2011   Social History   Social History  . Marital Status: Married    Spouse Name: N/A  . Number of Children: 1  . Years of Education: N/A   Occupational History  . MD     Social History Main Topics  . Smoking status: Never Smoker   . Smokeless tobacco: Never Used     Comment: tobacco use- no   . Alcohol Use: No  . Drug Use: No  . Sexual Activity: Not Asked   Other Topics Concern  . None   Social History Narrative   ** Merged History Encounter **       ** Data from: 09/05/10 Enc Dept: [facility]   Full time. Does not regularly exercise.        ** Data from: 05/15/11 Enc Dept: [facility]   Regular Exercise -  GYM, treadmill or elliptical 2 times a week x 30 min   Daily Caffeine Use:  Occasional soda          Review of Systems  Constitutional:  Negative for fever, chills, appetite change, fatigue and unexpected weight change.  Eyes: Negative for visual disturbance.  Respiratory: Negative for shortness of breath and wheezing.   Cardiovascular: Negative for chest pain and leg swelling.  Gastrointestinal: Negative for nausea, vomiting, abdominal pain, diarrhea and constipation.  Musculoskeletal: Negative for myalgias and arthralgias.  Skin: Negative for color change and rash.  Neurological: Negative for weakness.  Hematological: Negative for adenopathy. Does not bruise/bleed easily.  Psychiatric/Behavioral: Negative for suicidal ideas, sleep disturbance and dysphoric mood. The patient is not nervous/anxious.        Objective:    BP 101/69 mmHg  Pulse 70  Temp(Src) 97.5 F (36.4 C) (Oral)  Ht 5' 3.5" (1.613 m)  Wt 110 lb (49.896 kg)  BMI 19.18 kg/m2  SpO2 100% Physical Exam  Constitutional: She is oriented to person, place, and time. She appears well-developed and well-nourished. No distress.  HENT:  Head: Normocephalic and atraumatic.  Right Ear: External ear normal.  Left Ear: External ear normal.  Nose: Nose normal.  Mouth/Throat: Oropharynx is clear and moist. No oropharyngeal exudate.  Eyes: Conjunctivae and EOM are normal. Pupils are equal, round, and reactive to  light. Right eye exhibits no discharge.  Neck: Normal range of motion. Neck supple. No thyromegaly present.  Cardiovascular: Normal rate, regular rhythm, normal heart sounds and intact distal pulses.  Exam reveals no gallop and no friction rub.   No murmur heard. Pulmonary/Chest: Effort normal. No respiratory distress. She has no wheezes. She has no rales.  Abdominal: Soft. Bowel sounds are normal. She exhibits no distension and no mass. There is no tenderness. There is no rebound and no guarding.  Musculoskeletal: Normal range of motion. She exhibits no edema or tenderness.  Lymphadenopathy:    She has no cervical adenopathy.  Neurological: She is alert  and oriented to person, place, and time. No cranial nerve deficit. Coordination normal.  Skin: Skin is warm and dry. No rash noted. She is not diaphoretic. No erythema. No pallor.  Psychiatric: She has a normal mood and affect. Her behavior is normal. Judgment and thought content normal.          Assessment & Plan:   Problem List Items Addressed This Visit      Unprioritized   Routine general medical examination at a health care facility - Primary    General medical exam normal today. Breast and pelvic exam deferred given preference and s/p hysterectomy. Fasting labs ordered. Flu vaccine today. Encouraged healthy diet and exercise.      Relevant Orders   TSH   Comprehensive metabolic panel   Hemoglobin A1c   Lipid panel   Microalbumin / creatinine urine ratio   Vit D  25 hydroxy (rtn osteoporosis monitoring)   CBC with Differential/Platelet    Other Visit Diagnoses    Encounter for immunization            Return in about 1 year (around 11/01/2015) for Physical.

## 2014-11-01 NOTE — Patient Instructions (Signed)

## 2014-11-01 NOTE — Assessment & Plan Note (Signed)
General medical exam normal today. Breast and pelvic exam deferred given preference and s/p hysterectomy. Fasting labs ordered. Flu vaccine today. Encouraged healthy diet and exercise.

## 2014-11-01 NOTE — Progress Notes (Signed)
Pre visit review using our clinic review tool, if applicable. No additional management support is needed unless otherwise documented below in the visit note. 

## 2014-11-09 ENCOUNTER — Other Ambulatory Visit (INDEPENDENT_AMBULATORY_CARE_PROVIDER_SITE_OTHER): Payer: 59

## 2014-11-09 DIAGNOSIS — Z0000 Encounter for general adult medical examination without abnormal findings: Secondary | ICD-10-CM

## 2014-11-09 LAB — COMPREHENSIVE METABOLIC PANEL
ALBUMIN: 4.1 g/dL (ref 3.5–5.2)
ALK PHOS: 62 U/L (ref 39–117)
ALT: 29 U/L (ref 0–35)
AST: 30 U/L (ref 0–37)
BUN: 11 mg/dL (ref 6–23)
CO2: 26 mEq/L (ref 19–32)
CREATININE: 0.57 mg/dL (ref 0.40–1.20)
Calcium: 9.2 mg/dL (ref 8.4–10.5)
Chloride: 103 mEq/L (ref 96–112)
GFR: 128.09 mL/min (ref >60.00)
Glucose, Bld: 81 mg/dL (ref 70–99)
POTASSIUM: 3.8 meq/L (ref 3.5–5.1)
SODIUM: 138 meq/L (ref 135–145)
TOTAL PROTEIN: 7 g/dL (ref 6.0–8.3)
Total Bilirubin: 0.7 mg/dL (ref 0.2–1.2)

## 2014-11-09 LAB — LIPID PANEL
Cholesterol: 149 mg/dL (ref 0–200)
HDL: 63 mg/dL (ref >39.00)
LDL Cholesterol: 73 mg/dL (ref 0–99)
NONHDL: 85.99
Total CHOL/HDL Ratio: 2
Triglycerides: 65 mg/dL (ref 0.0–149.0)
VLDL: 13 mg/dL (ref 0.0–40.0)

## 2014-11-09 LAB — CBC WITH DIFFERENTIAL/PLATELET
BASOS ABS: 0 10*3/uL (ref 0.0–0.1)
Basophils Relative: 0.3 % (ref 0.0–3.0)
Eosinophils Absolute: 0 10*3/uL (ref 0.0–0.7)
Eosinophils Relative: 0.4 % (ref 0.0–5.0)
HCT: 40.3 % (ref 36.0–46.0)
Hemoglobin: 13.5 g/dL (ref 12.0–15.0)
LYMPHS ABS: 1.6 10*3/uL (ref 0.7–4.0)
Lymphocytes Relative: 28 % (ref 12.0–46.0)
MCHC: 33.5 g/dL (ref 30.0–36.0)
MCV: 86.2 fl (ref 78.0–100.0)
MONO ABS: 0.4 10*3/uL (ref 0.1–1.0)
Monocytes Relative: 7.7 % (ref 3.0–12.0)
NEUTROS PCT: 63.6 % (ref 43.0–77.0)
Neutro Abs: 3.6 10*3/uL (ref 1.4–7.7)
Platelets: 120 10*3/uL — ABNORMAL LOW (ref 150.0–400.0)
RBC: 4.68 Mil/uL (ref 3.87–5.11)
RDW: 13.4 % (ref 11.5–15.5)
WBC: 5.7 10*3/uL (ref 4.0–10.5)

## 2014-11-09 LAB — MICROALBUMIN / CREATININE URINE RATIO
Creatinine,U: 170.7 mg/dL
MICROALB UR: 2.9 mg/dL — AB (ref 0.0–1.9)
MICROALB/CREAT RATIO: 1.7 mg/g (ref 0.0–30.0)

## 2014-11-09 LAB — HEMOGLOBIN A1C: HEMOGLOBIN A1C: 5.2 % (ref 4.6–6.5)

## 2014-11-09 LAB — VITAMIN D 25 HYDROXY (VIT D DEFICIENCY, FRACTURES): VITD: 16.6 ng/mL — AB (ref 30.00–100.00)

## 2014-11-09 LAB — TSH: TSH: 1.39 u[IU]/mL (ref 0.35–4.50)

## 2014-11-16 ENCOUNTER — Encounter: Payer: Self-pay | Admitting: *Deleted

## 2014-11-26 ENCOUNTER — Encounter: Payer: Self-pay | Admitting: Internal Medicine

## 2014-11-29 NOTE — Telephone Encounter (Signed)
Pt insurance doesn't require a referral request with her insurance company.

## 2014-12-24 ENCOUNTER — Telehealth: Payer: Self-pay | Admitting: *Deleted

## 2014-12-24 NOTE — Telephone Encounter (Signed)
Pt left vm on Triage line requesting a call back, Left vm for pt to return my call

## 2015-03-16 ENCOUNTER — Encounter: Payer: Self-pay | Admitting: Internal Medicine

## 2015-10-03 ENCOUNTER — Encounter: Payer: Self-pay | Admitting: Family

## 2015-10-03 ENCOUNTER — Ambulatory Visit (INDEPENDENT_AMBULATORY_CARE_PROVIDER_SITE_OTHER): Payer: BLUE CROSS/BLUE SHIELD | Admitting: Family

## 2015-10-03 VITALS — BP 102/66 | HR 64 | Temp 98.4°F | Ht 63.25 in | Wt 113.6 lb

## 2015-10-03 DIAGNOSIS — Z0000 Encounter for general adult medical examination without abnormal findings: Secondary | ICD-10-CM

## 2015-10-03 DIAGNOSIS — Z23 Encounter for immunization: Secondary | ICD-10-CM

## 2015-10-03 NOTE — Progress Notes (Signed)
Subjective:    Patient ID: Emma Bates, female    DOB: 1980/09/09, 35 y.o.   MRN: [id number]  CC: Emma BaasRadhika Lynes is a 35 y.o. female who presents today for physical exam.    HPI: Patient presents to establish care and for routine physical. She overall feels well has no complaints today.       Cervical Cancer Screening: h/o  hysterectomy. She is no longer doing Pap smears  Immunizations       Tetanus -due  HIV Screening- Candidate for ; declines. Labs: Screening labs today. Exercise: Gets regular exercise.  Alcohol use: None Smoking/tobacco use: Nonsmoker.  Regular dental exams: UTD Wears seat belt: Yes.  HISTORY:  History reviewed. No pertinent past medical history.  Past Surgical History:  Procedure Laterality Date  . ABDOMINAL HYSTERECTOMY  2011  . CESAREAN SECTION  2011  . DIc     required intubation an dICU  . hysterectomy (other)     2011 emergent hysterectomy afte rc-sectio complicated hemorrhage    Family History  Problem Relation Age of Onset  . Diabetes Mother   . Hypertension Mother   . Hypertension Maternal Grandmother   . Stroke Paternal Grandfather   . Hypertension Paternal Grandfather   . Thyroid disease Sister   . Cancer Paternal Grandmother [id number]    ovarian   . Cancer Other   . Stroke Other   . Hypertension Other       ALLERGIES: Review of patient's allergies indicates no known allergies.  No current outpatient prescriptions on file prior to visit.   No current facility-administered medications on file prior to visit.     Social History  Substance Use Topics  . Smoking status: Never Smoker  . Smokeless tobacco: Never Used     Comment: tobacco use- no   . Alcohol use No    Review of Systems  Constitutional: Negative for chills, fever and unexpected weight change.  HENT: Negative for congestion.   Respiratory: Negative for cough.   Cardiovascular: Negative for chest pain, palpitations and leg swelling.  Gastrointestinal:  Negative for nausea and vomiting.  Musculoskeletal: Negative for arthralgias and myalgias.  Skin: Negative for rash.  Neurological: Negative for headaches.  Hematological: Negative for adenopathy.  Psychiatric/Behavioral: Negative for confusion.      Objective:    BP 102/66   Pulse 64   Temp 98.4 F (36.9 C) (Oral)   Ht 5' 3.25" (1.607 m)   Wt 113 lb 9.6 oz (51.5 kg)   SpO2 98%   BMI 19.96 kg/m   BP Readings from Last 3 Encounters:  10/03/15 102/66  11/01/14 101/69  04/27/14 98/64   Wt Readings from Last 3 Encounters:  10/03/15 113 lb 9.6 oz (51.5 kg)  11/01/14 110 lb (49.9 kg)  04/27/14 110 lb (49.9 kg)    Physical Exam  Constitutional: She appears well-developed and well-nourished.  Eyes: Conjunctivae are normal.  Neck: No thyroid mass and no thyromegaly present.  Cardiovascular: Normal rate, regular rhythm, normal heart sounds and normal pulses.   Pulmonary/Chest: Effort normal and breath sounds normal. She has no wheezes. She has no rhonchi. She has no rales. Right breast exhibits no inverted nipple, no mass, no nipple discharge, no skin change and no tenderness. Left breast exhibits no inverted nipple, no mass, no nipple discharge, no skin change and no tenderness. Breasts are symmetrical.  Neurological: She is alert.  Skin: Skin is warm and dry.  Psychiatric: She has a normal mood and affect. Her speech  is normal and behavior is normal. Thought content normal.  Vitals reviewed.      Assessment & Plan:   Problem List Items Addressed This Visit      Other   Routine general medical examination at a health care facility - Primary    Patient due for tetanus booster, will be given today. Patient has a history of hysterectomy and no longer doing Pap smears. She has no significant history of colon or breast cancer to warrant earlier screening.We discussed a family history of ovarian cancer of her paternal grandmother. It was never formally diagnosed however suspect in  [place] due to abdominal distention and metastasis. Patient has no abdominal pain, distention. Patient politely declined referral to heme-onc further surveillance at this time. Screening labs. Patient politely declined HIV screen.       Relevant Orders   CBC with Differential/Platelet   Comprehensive metabolic panel   Hemoglobin A1c   Lipid panel   TSH   VITAMIN D 25 Hydroxy (Vit-D Deficiency, Fractures)   Tdap vaccine greater than or equal to 7yo IM    Other Visit Diagnoses   None.      Ms. Nemiah CommanderKalisetti does not currently have medications on file.   No orders of the defined types were placed in this encounter.   Return precautions given.   Risks, benefits, and alternatives of the medications and treatment plan prescribed today were discussed, and patient expressed understanding.   Education regarding symptom management and diagnosis given to patient on AVS.   Continue to follow with Rennie PlowmanMargaret Arnett, FNP for routine health maintenance.   Emma Bates and I agreed with plan.   Rennie PlowmanMargaret Arnett, FNP

## 2015-10-03 NOTE — Patient Instructions (Signed)
Pleasure meeting you.   Health Maintenance, Female Adopting a healthy lifestyle and getting preventive care can go a long way to promote health and wellness. Talk with your health care provider about what schedule of regular examinations is right for you. This is a good chance for you to check in with your provider about disease prevention and staying healthy. In between checkups, there are plenty of things you can do on your own. Experts have done a lot of research about which lifestyle changes and preventive measures are most likely to keep you healthy. Ask your health care provider for more information. WEIGHT AND DIET  Eat a healthy diet  Be sure to include plenty of vegetables, fruits, low-fat dairy products, and lean protein.  Do not eat a lot of foods high in solid fats, added sugars, or salt.  Get regular exercise. This is one of the most important things you can do for your health.  Most adults should exercise for at least 150 minutes each week. The exercise should increase your heart rate and make you sweat (moderate-intensity exercise).  Most adults should also do strengthening exercises at least twice a week. This is in addition to the moderate-intensity exercise.  Maintain a healthy weight  Body mass index (BMI) is a measurement that can be used to identify possible weight problems. It estimates body fat based on height and weight. Your health care provider can help determine your BMI and help you achieve or maintain a healthy weight.  For females 20 years of age and older:   A BMI below 18.5 is considered underweight.  A BMI of 18.5 to 24.9 is normal.  A BMI of 25 to 29.9 is considered overweight.  A BMI of 30 and above is considered obese.  Watch levels of cholesterol and blood lipids  You should start having your blood tested for lipids and cholesterol at 36 years of age, then have this test every 5 years.  You may need to have your cholesterol levels checked more  often if:  Your lipid or cholesterol levels are high.  You are older than 36 years of age.  You are at high risk for heart disease.  CANCER SCREENING   Lung Cancer  Lung cancer screening is recommended for adults 55-80 years old who are at high risk for lung cancer because of a history of smoking.  A yearly low-dose CT scan of the lungs is recommended for people who:  Currently smoke.  Have quit within the past 15 years.  Have at least a 30-pack-year history of smoking. A pack year is smoking an average of one pack of cigarettes a day for 1 year.  Yearly screening should continue until it has been 15 years since you quit.  Yearly screening should stop if you develop a health problem that would prevent you from having lung cancer treatment.  Breast Cancer  Practice breast self-awareness. This means understanding how your breasts normally appear and feel.  It also means doing regular breast self-exams. Let your health care provider know about any changes, no matter how small.  If you are in your 20s or 30s, you should have a clinical breast exam (CBE) by a health care provider every 1-3 years as part of a regular health exam.  If you are 40 or older, have a CBE every year. Also consider having a breast X-ray (mammogram) every year.  If you have a family history of breast cancer, talk to your health care provider about   genetic screening.  If you are at high risk for breast cancer, talk to your health care provider about having an MRI and a mammogram every year.  Breast cancer gene (BRCA) assessment is recommended for women who have family members with BRCA-related cancers. BRCA-related cancers include:  Breast.  Ovarian.  Tubal.  Peritoneal cancers.  Results of the assessment will determine the need for genetic counseling and BRCA1 and BRCA2 testing. Cervical Cancer Your health care provider may recommend that you be screened regularly for cancer of the pelvic organs  (ovaries, uterus, and vagina). This screening involves a pelvic examination, including checking for microscopic changes to the surface of your cervix (Pap test). You may be encouraged to have this screening done every 3 years, beginning at age 42.  For women ages 79-65, health care providers may recommend pelvic exams and Pap testing every 3 years, or they may recommend the Pap and pelvic exam, combined with testing for human papilloma virus (HPV), every 5 years. Some types of HPV increase your risk of cervical cancer. Testing for HPV may also be done on women of any age with unclear Pap test results.  Other health care providers may not recommend any screening for nonpregnant women who are considered low risk for pelvic cancer and who do not have symptoms. Ask your health care provider if a screening pelvic exam is right for you.  If you have had past treatment for cervical cancer or a condition that could lead to cancer, you need Pap tests and screening for cancer for at least 20 years after your treatment. If Pap tests have been discontinued, your risk factors (such as having a new sexual partner) need to be reassessed to determine if screening should resume. Some women have medical problems that increase the chance of getting cervical cancer. In these cases, your health care provider may recommend more frequent screening and Pap tests. Colorectal Cancer  This type of cancer can be detected and often prevented.  Routine colorectal cancer screening usually begins at 36 years of age and continues through 36 years of age.  Your health care provider may recommend screening at an earlier age if you have risk factors for colon cancer.  Your health care provider may also recommend using home test kits to check for hidden blood in the stool.  A small camera at the end of a tube can be used to examine your colon directly (sigmoidoscopy or colonoscopy). This is done to check for the earliest forms of  colorectal cancer.  Routine screening usually begins at age 34.  Direct examination of the colon should be repeated every 5-10 years through 36 years of age. However, you may need to be screened more often if early forms of precancerous polyps or small growths are found. Skin Cancer  Check your skin from head to toe regularly.  Tell your health care provider about any new moles or changes in moles, especially if there is a change in a mole's shape or color.  Also tell your health care provider if you have a mole that is larger than the size of a pencil eraser.  Always use sunscreen. Apply sunscreen liberally and repeatedly throughout the day.  Protect yourself by wearing long sleeves, pants, a wide-brimmed hat, and sunglasses whenever you are outside. HEART DISEASE, DIABETES, AND HIGH BLOOD PRESSURE   High blood pressure causes heart disease and increases the risk of stroke. High blood pressure is more likely to develop in:  People who have blood  pressure in the high end of the normal range (130-139/85-89 mm Hg).  People who are overweight or obese.  People who are African American.  If you are 18-39 years of age, have your blood pressure checked every 3-5 years. If you are 40 years of age or older, have your blood pressure checked every year. You should have your blood pressure measured twice--once when you are at a hospital or clinic, and once when you are not at a hospital or clinic. Record the average of the two measurements. To check your blood pressure when you are not at a hospital or clinic, you can use:  An automated blood pressure machine at a pharmacy.  A home blood pressure monitor.  If you are between 55 years and 79 years old, ask your health care provider if you should take aspirin to prevent strokes.  Have regular diabetes screenings. This involves taking a blood sample to check your fasting blood sugar level.  If you are at a normal weight and have a low risk for  diabetes, have this test once every three years after 36 years of age.  If you are overweight and have a high risk for diabetes, consider being tested at a younger age or more often. PREVENTING INFECTION  Hepatitis B  If you have a higher risk for hepatitis B, you should be screened for this virus. You are considered at high risk for hepatitis B if:  You were born in a country where hepatitis B is common. Ask your health care provider which countries are considered high risk.  Your parents were born in a high-risk country, and you have not been immunized against hepatitis B (hepatitis B vaccine).  You have HIV or AIDS.  You use needles to inject street drugs.  You live with someone who has hepatitis B.  You have had sex with someone who has hepatitis B.  You get hemodialysis treatment.  You take certain medicines for conditions, including cancer, organ transplantation, and autoimmune conditions. Hepatitis C  Blood testing is recommended for:  Everyone born from 1945 through 1965.  Anyone with known risk factors for hepatitis C. Sexually transmitted infections (STIs)  You should be screened for sexually transmitted infections (STIs) including gonorrhea and chlamydia if:  You are sexually active and are younger than 36 years of age.  You are older than 36 years of age and your health care provider tells you that you are at risk for this type of infection.  Your sexual activity has changed since you were last screened and you are at an increased risk for chlamydia or gonorrhea. Ask your health care provider if you are at risk.  If you do not have HIV, but are at risk, it may be recommended that you take a prescription medicine daily to prevent HIV infection. This is called pre-exposure prophylaxis (PrEP). You are considered at risk if:  You are sexually active and do not regularly use condoms or know the HIV status of your partner(s).  You take drugs by injection.  You are  sexually active with a partner who has HIV. Talk with your health care provider about whether you are at high risk of being infected with HIV. If you choose to begin PrEP, you should first be tested for HIV. You should then be tested every 3 months for as long as you are taking PrEP.  PREGNANCY   If you are premenopausal and you may become pregnant, ask your health care provider about preconception   counseling.  If you may become pregnant, take 400 to 800 micrograms (mcg) of folic acid every day.  If you want to prevent pregnancy, talk to your health care provider about birth control (contraception). OSTEOPOROSIS AND MENOPAUSE   Osteoporosis is a disease in which the bones lose minerals and strength with aging. This can result in serious bone fractures. Your risk for osteoporosis can be identified using a bone density scan.  If you are 39 years of age or older, or if you are at risk for osteoporosis and fractures, ask your health care provider if you should be screened.  Ask your health care provider whether you should take a calcium or vitamin D supplement to lower your risk for osteoporosis.  Menopause may have certain physical symptoms and risks.  Hormone replacement therapy may reduce some of these symptoms and risks. Talk to your health care provider about whether hormone replacement therapy is right for you.  HOME CARE INSTRUCTIONS   Schedule regular health, dental, and eye exams.  Stay current with your immunizations.   Do not use any tobacco products including cigarettes, chewing tobacco, or electronic cigarettes.  If you are pregnant, do not drink alcohol.  If you are breastfeeding, limit how much and how often you drink alcohol.  Limit alcohol intake to no more than 1 drink per day for nonpregnant women. One drink equals 12 ounces of beer, 5 ounces of wine, or 1 ounces of hard liquor.  Do not use street drugs.  Do not share needles.  Ask your health care provider for  help if you need support or information about quitting drugs.  Tell your health care provider if you often feel depressed.  Tell your health care provider if you have ever been abused or do not feel safe at home.   This information is not intended to replace advice given to you by your health care provider. Make sure you discuss any questions you have with your health care provider.   Document Released: 08/21/2010 Document Revised: 02/26/2014 Document Reviewed: 01/07/2013 Elsevier Interactive Patient Education [employer].

## 2015-10-03 NOTE — Progress Notes (Signed)
Pre visit review using our clinic review tool, if applicable. No additional management support is needed unless otherwise documented below in the visit note. 

## 2015-10-03 NOTE — Assessment & Plan Note (Signed)
Patient due for tetanus booster, will be given today. Patient has a history of hysterectomy and no longer doing Pap smears. She has no significant history of colon or breast cancer to warrant earlier screening.We discussed a family history of ovarian cancer of her paternal grandmother. It was never formally diagnosed however suspect in [place] due to abdominal distention and metastasis. Patient has no abdominal pain, distention. Patient politely declined referral to heme-onc further surveillance at this time. Screening labs. Patient politely declined HIV screen.

## 2015-10-04 ENCOUNTER — Other Ambulatory Visit: Payer: BLUE CROSS/BLUE SHIELD

## 2015-10-04 ENCOUNTER — Encounter: Payer: Self-pay | Admitting: Family

## 2015-10-04 ENCOUNTER — Other Ambulatory Visit (INDEPENDENT_AMBULATORY_CARE_PROVIDER_SITE_OTHER): Payer: BLUE CROSS/BLUE SHIELD

## 2015-10-04 DIAGNOSIS — Z0000 Encounter for general adult medical examination without abnormal findings: Secondary | ICD-10-CM | POA: Diagnosis not present

## 2015-10-04 DIAGNOSIS — E559 Vitamin D deficiency, unspecified: Secondary | ICD-10-CM | POA: Diagnosis not present

## 2015-10-04 LAB — CBC WITH DIFFERENTIAL/PLATELET
BASOS PCT: 0.3 % (ref 0.0–3.0)
Basophils Absolute: 0 10*3/uL (ref 0.0–0.1)
EOS PCT: 0.9 % (ref 0.0–5.0)
Eosinophils Absolute: 0 10*3/uL (ref 0.0–0.7)
HEMATOCRIT: 38.7 % (ref 36.0–46.0)
HEMOGLOBIN: 13 g/dL (ref 12.0–15.0)
Lymphocytes Relative: 25.7 % (ref 12.0–46.0)
Lymphs Abs: 1.4 10*3/uL (ref 0.7–4.0)
MCHC: 33.6 g/dL (ref 30.0–36.0)
MCV: 85.7 fl (ref 78.0–100.0)
MONOS PCT: 7.7 % (ref 3.0–12.0)
Monocytes Absolute: 0.4 10*3/uL (ref 0.1–1.0)
Neutro Abs: 3.5 10*3/uL (ref 1.4–7.7)
Neutrophils Relative %: 65.4 % (ref 43.0–77.0)
Platelets: 135 10*3/uL — ABNORMAL LOW (ref 150.0–400.0)
RBC: 4.51 Mil/uL (ref 3.87–5.11)
RDW: 13.3 % (ref 11.5–15.5)
WBC: 5.3 10*3/uL (ref 4.0–10.5)

## 2015-10-04 LAB — VITAMIN D 25 HYDROXY (VIT D DEFICIENCY, FRACTURES): VITD: 11.13 ng/mL — AB (ref 30.00–100.00)

## 2015-10-04 LAB — LIPID PANEL
CHOLESTEROL: 138 mg/dL (ref 0–200)
HDL: 58.7 mg/dL (ref >39.00)
LDL Cholesterol: 63 mg/dL (ref 0–99)
NONHDL: 79.6
Total CHOL/HDL Ratio: 2
Triglycerides: 81 mg/dL (ref 0.0–149.0)
VLDL: 16.2 mg/dL (ref 0.0–40.0)

## 2015-10-04 LAB — COMPREHENSIVE METABOLIC PANEL
ALBUMIN: 4 g/dL (ref 3.5–5.2)
ALK PHOS: 61 U/L (ref 39–117)
ALT: 16 U/L (ref 0–35)
AST: 21 U/L (ref 0–37)
BUN: 8 mg/dL (ref 6–23)
CALCIUM: 9 mg/dL (ref 8.4–10.5)
CO2: 28 mEq/L (ref 19–32)
Chloride: 106 mEq/L (ref 96–112)
Creatinine, Ser: 0.61 mg/dL (ref 0.40–1.20)
GFR: 117.85 mL/min (ref >60.00)
Glucose, Bld: 92 mg/dL (ref 70–99)
POTASSIUM: 4.2 meq/L (ref 3.5–5.1)
Sodium: 139 mEq/L (ref 135–145)
TOTAL PROTEIN: 6.5 g/dL (ref 6.0–8.3)
Total Bilirubin: 0.5 mg/dL (ref 0.2–1.2)

## 2015-10-04 LAB — TSH: TSH: 1.66 u[IU]/mL (ref 0.35–4.50)

## 2015-10-04 LAB — HEMOGLOBIN A1C: HEMOGLOBIN A1C: 5.2 % (ref 4.6–6.5)

## 2015-10-13 ENCOUNTER — Encounter: Payer: Self-pay | Admitting: Family

## 2015-10-13 DIAGNOSIS — E559 Vitamin D deficiency, unspecified: Secondary | ICD-10-CM

## 2015-10-13 MED ORDER — CHOLECALCIFEROL 25 MCG (1000 UT) PO CAPS: 1000.0000 [IU] | ORAL_CAPSULE | Freq: Every day | ORAL | 3 refills | Status: DC

## 2015-10-13 MED ORDER — CHOLECALCIFEROL 1.25 MG (50000 UT) PO TABS
ORAL_TABLET | ORAL | 0 refills | Status: DC
Start: 2015-10-13 — End: 2015-10-13

## 2015-10-27 ENCOUNTER — Telehealth: Payer: Self-pay | Admitting: Family

## 2015-10-27 NOTE — Telephone Encounter (Signed)
Please call patient-  Apologize for confusion.   Patient's Vit D level is 16 and based on treatment guidelines, she needs cholecalciferol 1000 units daily .  If vit D had been less than 10, she would need 50,000 units per week which I initially sent to pharmacy. I then realized I had sent to big a dose and canceled 50,000. I then wrote for 1000 units daily.    Hope this makes sense!

## 2015-10-27 NOTE — Telephone Encounter (Signed)
Please advise, see mychart for more details, thanks  (it was ordered as 1000 and not 50000)

## 2015-10-27 NOTE — Telephone Encounter (Signed)
Pt thougth that Jason Cooprnett was calling about her Cholecalciferol 1000 units capsule, it was suppose to be 50,000 units weekly. Please call her at [phone].

## 2015-10-28 NOTE — Telephone Encounter (Signed)
Left message for patient to return phone call with her husband.

## 2015-10-28 NOTE — Telephone Encounter (Signed)
See below , thanks

## 2015-11-02 ENCOUNTER — Encounter: Payer: Self-pay | Admitting: Family

## 2015-11-02 ENCOUNTER — Telehealth: Payer: Self-pay | Admitting: *Deleted

## 2015-11-02 MED ORDER — CHOLECALCIFEROL 25 MCG (1000 UT) PO CAPS
1000.0000 [IU] | ORAL_CAPSULE | Freq: Every day | ORAL | 3 refills | Status: DC
Start: 2015-11-02 — End: 2015-11-03

## 2015-11-02 NOTE — Telephone Encounter (Signed)
Patient has request to have her Vitamin D Rx [facility] over [facility]

## 2015-11-02 NOTE — Telephone Encounter (Signed)
Refilled rx

## 2015-11-03 ENCOUNTER — Other Ambulatory Visit: Payer: Self-pay | Admitting: Family

## 2015-11-03 DIAGNOSIS — E559 Vitamin D deficiency, unspecified: Secondary | ICD-10-CM

## 2015-11-03 MED ORDER — CHOLECALCIFEROL 1.25 MG (50000 UT) PO TABS: ORAL_TABLET | ORAL | 0 refills | Status: DC

## 2015-11-03 MED ORDER — CHOLECALCIFEROL 25 MCG (1000 UT) PO CAPS: 1000.0000 [IU] | ORAL_CAPSULE | Freq: Every day | ORAL | 3 refills | Status: DC

## 2015-11-03 NOTE — Telephone Encounter (Signed)
The medication is an OTC 1000 Units. Only 50000 units can be [facility] [employer] [facility]. Please advise.

## 2016-01-18 ENCOUNTER — Encounter: Payer: Self-pay | Admitting: Family

## 2016-01-18 ENCOUNTER — Other Ambulatory Visit: Payer: Self-pay | Admitting: Family

## 2016-01-18 DIAGNOSIS — E559 Vitamin D deficiency, unspecified: Secondary | ICD-10-CM

## 2016-01-19 ENCOUNTER — Telehealth: Payer: Self-pay | Admitting: Family

## 2016-01-19 DIAGNOSIS — E559 Vitamin D deficiency, unspecified: Secondary | ICD-10-CM

## 2016-01-19 MED ORDER — CHOLECALCIFEROL 1.25 MG (50000 UT) PO TABS: ORAL_TABLET | ORAL | 1 refills | Status: DC

## 2016-01-19 NOTE — Telephone Encounter (Signed)
Ordered via [employer] [facility]

## 2016-01-19 NOTE — Telephone Encounter (Signed)
Patient would like to keep taking the medication due to her low levels. Please advise.

## 2016-04-12 ENCOUNTER — Encounter: Payer: Self-pay | Admitting: Family

## 2016-04-13 ENCOUNTER — Telehealth: Payer: Self-pay

## 2016-04-13 DIAGNOSIS — E559 Vitamin D deficiency, unspecified: Secondary | ICD-10-CM

## 2016-04-13 MED ORDER — CHOLECALCIFEROL 1.25 MG (50000 UT) PO TABS: ORAL_TABLET | ORAL | 1 refills | Status: DC

## 2016-04-13 NOTE — Telephone Encounter (Signed)
Medication has been refilled.

## 2016-04-17 ENCOUNTER — Encounter: Payer: Self-pay | Admitting: Family

## 2016-10-04 ENCOUNTER — Encounter: Payer: Self-pay | Admitting: Family

## 2016-10-04 ENCOUNTER — Ambulatory Visit (INDEPENDENT_AMBULATORY_CARE_PROVIDER_SITE_OTHER): Payer: BLUE CROSS/BLUE SHIELD | Admitting: Family

## 2016-10-04 VITALS — BP 104/64 | HR 57 | Temp 98.4°F | Ht 63.25 in | Wt 110.8 lb

## 2016-10-04 DIAGNOSIS — Z0000 Encounter for general adult medical examination without abnormal findings: Secondary | ICD-10-CM | POA: Diagnosis not present

## 2016-10-04 DIAGNOSIS — E559 Vitamin D deficiency, unspecified: Secondary | ICD-10-CM

## 2016-10-04 LAB — CBC WITH DIFFERENTIAL/PLATELET
BASOS PCT: 0.2 % (ref 0.0–3.0)
Basophils Absolute: 0 10*3/uL (ref 0.0–0.1)
EOS PCT: 1 % (ref 0.0–5.0)
Eosinophils Absolute: 0 10*3/uL (ref 0.0–0.7)
HCT: 40.8 % (ref 36.0–46.0)
HEMOGLOBIN: 13.6 g/dL (ref 12.0–15.0)
LYMPHS ABS: 1.3 10*3/uL (ref 0.7–4.0)
Lymphocytes Relative: 31.3 % (ref 12.0–46.0)
MCHC: 33.3 g/dL (ref 30.0–36.0)
MCV: 87.1 fl (ref 78.0–100.0)
MONO ABS: 0.3 10*3/uL (ref 0.1–1.0)
MONOS PCT: 8.2 % (ref 3.0–12.0)
NEUTROS ABS: 2.5 10*3/uL (ref 1.4–7.7)
NEUTROS PCT: 59.3 % (ref 43.0–77.0)
Platelets: 136 10*3/uL — ABNORMAL LOW (ref 150.0–400.0)
RBC: 4.69 Mil/uL (ref 3.87–5.11)
RDW: 13.4 % (ref 11.5–15.5)
WBC: 4.1 10*3/uL (ref 4.0–10.5)

## 2016-10-04 LAB — COMPREHENSIVE METABOLIC PANEL
ALK PHOS: 59 U/L (ref 39–117)
ALT: 14 U/L (ref 0–35)
AST: 22 U/L (ref 0–37)
Albumin: 4.1 g/dL (ref 3.5–5.2)
BILIRUBIN TOTAL: 0.6 mg/dL (ref 0.2–1.2)
BUN: 8 mg/dL (ref 6–23)
CO2: 29 mEq/L (ref 19–32)
CREATININE: 0.54 mg/dL (ref 0.40–1.20)
Calcium: 9.1 mg/dL (ref 8.4–10.5)
Chloride: 106 mEq/L (ref 96–112)
GFR: 134.89 mL/min (ref >60.00)
GLUCOSE: 93 mg/dL (ref 70–99)
Potassium: 4.4 mEq/L (ref 3.5–5.1)
SODIUM: 139 meq/L (ref 135–145)
TOTAL PROTEIN: 6.1 g/dL (ref 6.0–8.3)

## 2016-10-04 LAB — LIPID PANEL
CHOLESTEROL: 149 mg/dL (ref 0–200)
HDL: 58.6 mg/dL (ref >39.00)
LDL Cholesterol: 78 mg/dL (ref 0–99)
NONHDL: 89.91
TRIGLYCERIDES: 59 mg/dL (ref 0.0–149.0)
Total CHOL/HDL Ratio: 3
VLDL: 11.8 mg/dL (ref 0.0–40.0)

## 2016-10-04 LAB — TSH: TSH: 1.94 u[IU]/mL (ref 0.35–4.50)

## 2016-10-04 LAB — VITAMIN D 25 HYDROXY (VIT D DEFICIENCY, FRACTURES): VITD: 22.01 ng/mL — ABNORMAL LOW (ref 30.00–100.00)

## 2016-10-04 LAB — HEMOGLOBIN A1C: Hgb A1c MFr Bld: 5.4 % (ref 4.6–6.5)

## 2016-10-04 MED ORDER — CHOLECALCIFEROL 1.25 MG (50000 UT) PO TABS: ORAL_TABLET | ORAL | 4 refills | Status: DC

## 2016-10-04 NOTE — Assessment & Plan Note (Signed)
Declines clinical breast exam based on preference. Will repeat this next year. Screening labs ordered. Consented for HIV. No early family history breast or colon cancer to warrant further screening at this time. Declines pelvic exam in the context of hysterectomy and no pelvic complaints today. Encouraged exercise.

## 2016-10-04 NOTE — Progress Notes (Signed)
Subjective:    Patient ID: Emma Bates, female    DOB: March 21, 1980, 36 y.o.   MRN: [id number]  CC: Emma Bates is a 36 y.o. female who presents today for physical exam.    HPI: Feeling well today No complaints  Continues to take vitamin D 50,000 units weekly as this is easier for her.        Colorectal Cancer Screening: no early family history Breast Cancer Screening: no early family history; declines breast exam and would like to do every 2 years. No changes noted to breasts when she does SBE.  Cervical Cancer Screening: h/o hysterectomy; No dyspareunia, abdominal distention, pelvic pain. Again discussed family history of ovarian cancer,politely declines any screening today Bone Health screening/DEXA for 65+: No increased fracture risk. Defer screening at this time. Lung Cancer Screening: Doesn't have 30 year pack year history and age > 55 years       Tetanus - utd         HIV Screening- Candidate for , consents Labs: Screening labs today. Exercise: Gets regular exercise.  Alcohol use: none Smoking/tobacco use: Nonsmoker.  Wears seat belt: Yes.  HISTORY:  History reviewed. No pertinent past medical history.  Past Surgical History:  Procedure Laterality Date  . ABDOMINAL HYSTERECTOMY  2011  . CESAREAN SECTION  2011  . DIc     required intubation an dICU  . hysterectomy (other)     2011 emergent hysterectomy afte rc-sectio complicated hemorrhage    Family History  Problem Relation Age of Onset  . Diabetes Mother   . Hypertension Mother   . Hypertension Maternal Grandmother   . Stroke Paternal Grandfather   . Hypertension Paternal Grandfather   . Thyroid disease Sister   . Cancer Paternal Grandmother [id number]       ovarian   . Cancer Other   . Stroke Other   . Hypertension Other       ALLERGIES: Patient has no known allergies.  No current outpatient prescriptions on file prior to visit.   No current facility-administered medications on file prior  to visit.     Social History  Substance Use Topics  . Smoking status: Never Smoker  . Smokeless tobacco: Never Used     Comment: tobacco use- no   . Alcohol use No    Review of Systems  Constitutional: Negative for chills, fever and unexpected weight change.  HENT: Negative for congestion.   Respiratory: Negative for cough.   Cardiovascular: Negative for chest pain, palpitations and leg swelling.  Gastrointestinal: Negative for nausea and vomiting.  Musculoskeletal: Negative for arthralgias and myalgias.  Skin: Negative for rash.  Neurological: Negative for headaches.  Hematological: Negative for adenopathy.  Psychiatric/Behavioral: Negative for confusion.      Objective:    BP 104/64   Pulse (!) 57   Temp 98.4 F (36.9 C) (Oral)   Ht 5' 3.25" (1.607 m)   Wt 110 lb 12.8 oz (50.3 kg)   SpO2 98%   BMI 19.47 kg/m   BP Readings from Last 3 Encounters:  10/04/16 104/64  10/03/15 102/66  11/01/14 101/69   Wt Readings from Last 3 Encounters:  10/04/16 110 lb 12.8 oz (50.3 kg)  10/03/15 113 lb 9.6 oz (51.5 kg)  11/01/14 110 lb (49.9 kg)    Physical Exam  Constitutional: She appears well-developed and well-nourished.  Eyes: Conjunctivae are normal.  Neck: No thyroid mass and no thyromegaly present.  Cardiovascular: Normal rate, regular rhythm, normal heart sounds and normal  pulses.   Pulmonary/Chest: Effort normal and breath sounds normal. She has no wheezes. She has no rhonchi. She has no rales.  Declines CBE  Lymphadenopathy:       Head (right side): No submental, no submandibular, no tonsillar, no preauricular, no posterior auricular and no occipital adenopathy present.       Head (left side): No submental, no submandibular, no tonsillar, no preauricular, no posterior auricular and no occipital adenopathy present.    She has no cervical adenopathy.       Right cervical: No superficial cervical, no deep cervical and no posterior cervical adenopathy present.       Left cervical: No superficial cervical, no deep cervical and no posterior cervical adenopathy present.    She has no axillary adenopathy.  Neurological: She is alert.  Skin: Skin is warm and dry.  Psychiatric: She has a normal mood and affect. Her speech is normal and behavior is normal. Thought content normal.  Vitals reviewed.      Assessment & Plan:   Problem List Items Addressed This Visit      Other   Routine general medical examination at a health care facility    Declines clinical breast exam based on preference. Will repeat this next year. Screening labs ordered. Consented for HIV. No early family history breast or colon cancer to warrant further screening at this time. Declines pelvic exam in the context of hysterectomy and no pelvic complaints today. Encouraged exercise.       Vitamin D deficiency    On 50,000 units weekly. Pending vitamin D study.      Relevant Medications   Cholecalciferol 50000 units TABS    Other Visit Diagnoses    Routine physical examination    -  Primary   Relevant Orders   CBC with Differential/Platelet   Comprehensive metabolic panel   Hemoglobin A1c   HIV antibody   Lipid panel   TSH   VITAMIN D 25 Hydroxy (Vit-D Deficiency, Fractures)       I am having Emma Bates maintain her Cholecalciferol.   Meds ordered this encounter  Medications  . Cholecalciferol 50000 units TABS    Sig: 50,000 units PO qwk for 8 weeks.    Dispense:  8 tablet    Refill:  4    Order Specific Question:   Supervising Provider    Answer:   Sherlene Shams [[id number]]    Return precautions given.   Risks, benefits, and alternatives of the medications and treatment plan prescribed today were discussed, and patient expressed understanding.   Education regarding symptom management and diagnosis given to patient on AVS.   Continue to follow with Allegra Grana, FNP for routine health maintenance.   Emma Baas and I agreed with plan.   Rennie Plowman, FNP

## 2016-10-04 NOTE — Progress Notes (Signed)
Pre visit review using our clinic review tool, if applicable. No additional management support is needed unless otherwise documented below in the visit note. 

## 2016-10-04 NOTE — Assessment & Plan Note (Addendum)
On 50,000 units weekly. Pending vitamin D study.

## 2016-10-04 NOTE — Patient Instructions (Addendum)
Pleasure seeing you  Labs today  Health Maintenance, Female Adopting a healthy lifestyle and getting preventive care can go a long way to promote health and wellness. Talk with your health care provider about what schedule of regular examinations is right for you. This is a good chance for you to check in with your provider about disease prevention and staying healthy. In between checkups, there are plenty of things you can do on your own. Experts have done a lot of research about which lifestyle changes and preventive measures are most likely to keep you healthy. Ask your health care provider for more information. Weight and diet Eat a healthy diet  Be sure to include plenty of vegetables, fruits, low-fat dairy products, and lean protein.  Do not eat a lot of foods high in solid fats, added sugars, or salt.  Get regular exercise. This is one of the most important things you can do for your health. ? Most adults should exercise for at least 150 minutes each week. The exercise should increase your heart rate and make you sweat (moderate-intensity exercise). ? Most adults should also do strengthening exercises at least twice a week. This is in addition to the moderate-intensity exercise.  Maintain a healthy weight  Body mass index (BMI) is a measurement that can be used to identify possible weight problems. It estimates body fat based on height and weight. Your health care provider can help determine your BMI and help you achieve or maintain a healthy weight.  For females 44 years of age and older: ? A BMI below 18.5 is considered underweight. ? A BMI of 18.5 to 24.9 is normal. ? A BMI of 25 to 29.9 is considered overweight. ? A BMI of 30 and above is considered obese.  Watch levels of cholesterol and blood lipids  You should start having your blood tested for lipids and cholesterol at 37 years of age, then have this test every 5 years.  You may need to have your cholesterol levels  checked more often if: ? Your lipid or cholesterol levels are high. ? You are older than 37 years of age. ? You are at high risk for heart disease.  Cancer screening Lung Cancer  Lung cancer screening is recommended for adults 69-56 years old who are at high risk for lung cancer because of a history of smoking.  A yearly low-dose CT scan of the lungs is recommended for people who: ? Currently smoke. ? Have quit within the past 15 years. ? Have at least a 30-pack-year history of smoking. A pack year is smoking an average of one pack of cigarettes a day for 1 year.  Yearly screening should continue until it has been 15 years since you quit.  Yearly screening should stop if you develop a health problem that would prevent you from having lung cancer treatment.  Breast Cancer  Practice breast self-awareness. This means understanding how your breasts normally appear and feel.  It also means doing regular breast self-exams. Let your health care provider know about any changes, no matter how small.  If you are in your 20s or 30s, you should have a clinical breast exam (CBE) by a health care provider every 1-3 years as part of a regular health exam.  If you are 76 or older, have a CBE every year. Also consider having a breast X-ray (mammogram) every year.  If you have a family history of breast cancer, talk to your health care provider about genetic screening.  If you are at high risk for breast cancer, talk to your health care provider about having an MRI and a mammogram every year.  Breast cancer gene (BRCA) assessment is recommended for women who have family members with BRCA-related cancers. BRCA-related cancers include: ? Breast. ? Ovarian. ? Tubal. ? Peritoneal cancers.  Results of the assessment will determine the need for genetic counseling and BRCA1 and BRCA2 testing.  Cervical Cancer Your health care provider may recommend that you be screened regularly for cancer of the  pelvic organs (ovaries, uterus, and vagina). This screening involves a pelvic examination, including checking for microscopic changes to the surface of your cervix (Pap test). You may be encouraged to have this screening done every 3 years, beginning at age 71.  For women ages 82-65, health care providers may recommend pelvic exams and Pap testing every 3 years, or they may recommend the Pap and pelvic exam, combined with testing for human papilloma virus (HPV), every 5 years. Some types of HPV increase your risk of cervical cancer. Testing for HPV may also be done on women of any age with unclear Pap test results.  Other health care providers may not recommend any screening for nonpregnant women who are considered low risk for pelvic cancer and who do not have symptoms. Ask your health care provider if a screening pelvic exam is right for you.  If you have had past treatment for cervical cancer or a condition that could lead to cancer, you need Pap tests and screening for cancer for at least 20 years after your treatment. If Pap tests have been discontinued, your risk factors (such as having a new sexual partner) need to be reassessed to determine if screening should resume. Some women have medical problems that increase the chance of getting cervical cancer. In these cases, your health care provider may recommend more frequent screening and Pap tests.  Colorectal Cancer  This type of cancer can be detected and often prevented.  Routine colorectal cancer screening usually begins at 37 years of age and continues through 37 years of age.  Your health care provider may recommend screening at an earlier age if you have risk factors for colon cancer.  Your health care provider may also recommend using home test kits to check for hidden blood in the stool.  A small camera at the end of a tube can be used to examine your colon directly (sigmoidoscopy or colonoscopy). This is done to check for the  earliest forms of colorectal cancer.  Routine screening usually begins at age 15.  Direct examination of the colon should be repeated every 5-10 years through 37 years of age. However, you may need to be screened more often if early forms of precancerous polyps or small growths are found.  Skin Cancer  Check your skin from head to toe regularly.  Tell your health care provider about any new moles or changes in moles, especially if there is a change in a mole's shape or color.  Also tell your health care provider if you have a mole that is larger than the size of a pencil eraser.  Always use sunscreen. Apply sunscreen liberally and repeatedly throughout the day.  Protect yourself by wearing long sleeves, pants, a wide-brimmed hat, and sunglasses whenever you are outside.  Heart disease, diabetes, and high blood pressure  High blood pressure causes heart disease and increases the risk of stroke. High blood pressure is more likely to develop in: ? People who have blood  pressure in the high end of the normal range (130-139/85-89 mm Hg). ? People who are overweight or obese. ? People who are African American.  If you are 56-87 years of age, have your blood pressure checked every 3-5 years. If you are 74 years of age or older, have your blood pressure checked every year. You should have your blood pressure measured twice-once when you are at a hospital or clinic, and once when you are not at a hospital or clinic. Record the average of the two measurements. To check your blood pressure when you are not at a hospital or clinic, you can use: ? An automated blood pressure machine at a pharmacy. ? A home blood pressure monitor.  If you are between 69 years and 79 years old, ask your health care provider if you should take aspirin to prevent strokes.  Have regular diabetes screenings. This involves taking a blood sample to check your fasting blood sugar level. ? If you are at a normal weight and  have a low risk for diabetes, have this test once every three years after 37 years of age. ? If you are overweight and have a high risk for diabetes, consider being tested at a younger age or more often. Preventing infection Hepatitis B  If you have a higher risk for hepatitis B, you should be screened for this virus. You are considered at high risk for hepatitis B if: ? You were born in a country where hepatitis B is common. Ask your health care provider which countries are considered high risk. ? Your parents were born in a high-risk country, and you have not been immunized against hepatitis B (hepatitis B vaccine). ? You have HIV or AIDS. ? You use needles to inject street drugs. ? You live with someone who has hepatitis B. ? You have had sex with someone who has hepatitis B. ? You get hemodialysis treatment. ? You take certain medicines for conditions, including cancer, organ transplantation, and autoimmune conditions.  Hepatitis C  Blood testing is recommended for: ? Everyone born from [birth year] through 1966. ? Anyone with known risk factors for hepatitis C.  Sexually transmitted infections (STIs)  You should be screened for sexually transmitted infections (STIs) including gonorrhea and chlamydia if: ? You are sexually active and are younger than 37 years of age. ? You are older than 37 years of age and your health care provider tells you that you are at risk for this type of infection. ? Your sexual activity has changed since you were last screened and you are at an increased risk for chlamydia or gonorrhea. Ask your health care provider if you are at risk.  If you do not have HIV, but are at risk, it may be recommended that you take a prescription medicine daily to prevent HIV infection. This is called pre-exposure prophylaxis (PrEP). You are considered at risk if: ? You are sexually active and do not regularly use condoms or know the HIV status of your partner(s). ? You take drugs by  injection. ? You are sexually active with a partner who has HIV.  Talk with your health care provider about whether you are at high risk of being infected with HIV. If you choose to begin PrEP, you should first be tested for HIV. You should then be tested every 3 months for as long as you are taking PrEP. Pregnancy  If you are premenopausal and you may become pregnant, ask your health care provider about preconception  counseling.  If you may become pregnant, take 400 to 800 micrograms (mcg) of folic acid every day.  If you want to prevent pregnancy, talk to your health care provider about birth control (contraception). Osteoporosis and menopause  Osteoporosis is a disease in which the bones lose minerals and strength with aging. This can result in serious bone fractures. Your risk for osteoporosis can be identified using a bone density scan.  If you are 54 years of age or older, or if you are at risk for osteoporosis and fractures, ask your health care provider if you should be screened.  Ask your health care provider whether you should take a calcium or vitamin D supplement to lower your risk for osteoporosis.  Menopause may have certain physical symptoms and risks.  Hormone replacement therapy may reduce some of these symptoms and risks. Talk to your health care provider about whether hormone replacement therapy is right for you. Follow these instructions at home:  Schedule regular health, dental, and eye exams.  Stay current with your immunizations.  Do not use any tobacco products including cigarettes, chewing tobacco, or electronic cigarettes.  If you are pregnant, do not drink alcohol.  If you are breastfeeding, limit how much and how often you drink alcohol.  Limit alcohol intake to no more than 1 drink per day for nonpregnant women. One drink equals 12 ounces of beer, 5 ounces of wine, or 1 ounces of hard liquor.  Do not use street drugs.  Do not share needles.  Ask  your health care provider for help if you need support or information about quitting drugs.  Tell your health care provider if you often feel depressed.  Tell your health care provider if you have ever been abused or do not feel safe at home. This information is not intended to replace advice given to you by your health care provider. Make sure you discuss any questions you have with your health care provider. Document Released: 08/21/2010 Document Revised: 07/14/2015 Document Reviewed: 11/09/2014 Elsevier Interactive Patient Education  [employer].

## 2016-10-05 LAB — HIV ANTIBODY (ROUTINE TESTING W REFLEX): HIV: NONREACTIVE

## 2016-11-26 ENCOUNTER — Encounter: Payer: Self-pay | Admitting: Family

## 2016-11-26 DIAGNOSIS — E559 Vitamin D deficiency, unspecified: Secondary | ICD-10-CM

## 2016-11-26 MED ORDER — CHOLECALCIFEROL 1.25 MG (50000 UT) PO TABS: ORAL_TABLET | ORAL | 4 refills | Status: DC

## 2017-08-20 ENCOUNTER — Encounter (INDEPENDENT_AMBULATORY_CARE_PROVIDER_SITE_OTHER): Payer: Self-pay

## 2017-10-09 ENCOUNTER — Encounter: Payer: BLUE CROSS/BLUE SHIELD | Admitting: Family

## 2017-10-23 ENCOUNTER — Ambulatory Visit (INDEPENDENT_AMBULATORY_CARE_PROVIDER_SITE_OTHER): Payer: 59 | Admitting: Family

## 2017-10-23 ENCOUNTER — Encounter: Payer: Self-pay | Admitting: Family

## 2017-10-23 VITALS — BP 98/64 | HR 67 | Temp 98.1°F | Resp 15 | Ht 63.5 in | Wt 111.1 lb

## 2017-10-23 DIAGNOSIS — E559 Vitamin D deficiency, unspecified: Secondary | ICD-10-CM | POA: Diagnosis not present

## 2017-10-23 DIAGNOSIS — E162 Hypoglycemia, unspecified: Secondary | ICD-10-CM | POA: Diagnosis not present

## 2017-10-23 DIAGNOSIS — Z0000 Encounter for general adult medical examination without abnormal findings: Secondary | ICD-10-CM | POA: Diagnosis not present

## 2017-10-23 DIAGNOSIS — Z23 Encounter for immunization: Secondary | ICD-10-CM

## 2017-10-23 LAB — CBC WITH DIFFERENTIAL/PLATELET
BASOS PCT: 0.3 % (ref 0.0–3.0)
Basophils Absolute: 0 10*3/uL (ref 0.0–0.1)
EOS ABS: 0 10*3/uL (ref 0.0–0.7)
Eosinophils Relative: 0.3 % (ref 0.0–5.0)
HEMATOCRIT: 41.3 % (ref 36.0–46.0)
Hemoglobin: 14 g/dL (ref 12.0–15.0)
LYMPHS ABS: 1.3 10*3/uL (ref 0.7–4.0)
LYMPHS PCT: 21.5 % (ref 12.0–46.0)
MCHC: 33.9 g/dL (ref 30.0–36.0)
MCV: 85.8 fl (ref 78.0–100.0)
MONOS PCT: 7.8 % (ref 3.0–12.0)
Monocytes Absolute: 0.5 10*3/uL (ref 0.1–1.0)
NEUTROS ABS: 4.2 10*3/uL (ref 1.4–7.7)
NEUTROS PCT: 70.1 % (ref 43.0–77.0)
PLATELETS: 128 10*3/uL — AB (ref 150.0–400.0)
RBC: 4.81 Mil/uL (ref 3.87–5.11)
RDW: 13.5 % (ref 11.5–15.5)
WBC: 5.9 10*3/uL (ref 4.0–10.5)

## 2017-10-23 LAB — COMPREHENSIVE METABOLIC PANEL
ALT: 17 U/L (ref 0–35)
AST: 27 U/L (ref 0–37)
Albumin: 4.3 g/dL (ref 3.5–5.2)
Alkaline Phosphatase: 51 U/L (ref 39–117)
BUN: 9 mg/dL (ref 6–23)
CALCIUM: 9.6 mg/dL (ref 8.4–10.5)
CHLORIDE: 103 meq/L (ref 96–112)
CO2: 28 meq/L (ref 19–32)
Creatinine, Ser: 0.64 mg/dL (ref 0.40–1.20)
GFR: 110.25 mL/min (ref >60.00)
GLUCOSE: 84 mg/dL (ref 70–99)
Potassium: 4.2 mEq/L (ref 3.5–5.1)
Sodium: 138 mEq/L (ref 135–145)
Total Bilirubin: 0.7 mg/dL (ref 0.2–1.2)
Total Protein: 6.9 g/dL (ref 6.0–8.3)

## 2017-10-23 LAB — VITAMIN D 25 HYDROXY (VIT D DEFICIENCY, FRACTURES): VITD: 51.05 ng/mL (ref 30.00–100.00)

## 2017-10-23 LAB — LIPID PANEL
CHOLESTEROL: 162 mg/dL (ref 0–200)
HDL: 73.4 mg/dL (ref >39.00)
LDL CALC: 76 mg/dL (ref 0–99)
NonHDL: 88.73
TRIGLYCERIDES: 64 mg/dL (ref 0.0–149.0)
Total CHOL/HDL Ratio: 2
VLDL: 12.8 mg/dL (ref 0.0–40.0)

## 2017-10-23 LAB — HEMOGLOBIN A1C: Hgb A1c MFr Bld: 5.4 % (ref 4.6–6.5)

## 2017-10-23 LAB — TSH: TSH: 1.99 u[IU]/mL (ref 0.35–4.50)

## 2017-10-23 NOTE — Progress Notes (Signed)
Subjective:    Patient ID: Emma Bates, female    DOB: 1981/01/28, 37 y.o.   MRN: [id number]  CC: Emma Bates is a 37 y.o. female who presents today for physical exam.    HPI: Feeling well today.   She does note that recently while wearing CGM as part of study regarding intermittent fasting with nephrologist friend, she had noctural hypoglycemic episodes. Didn't recall significant symptoms at that time although thinks sleep was interrupted. Would like c peptide ordered  H/o vitamin D deficiency. Taking 03559 units most weeks.      Colorectal Cancer Screening: no early family history.  Breast Cancer Screening: no early family history; declines baseline mammogram today Cervical Cancer Screening: h/o hysterectomy. No vaginal bleeding.  Bone Health screening/DEXA for 65+: No increased fracture risk. Defer screening at this time. Lung Cancer Screening: Doesn't have 30 year pack year history and age > 55 years. Immunizations       Tetanus - utd       Labs: Screening labs today. Exercise: Gets regular exercise.  Alcohol use: none Smoking/tobacco use: Nonsmoker.    HISTORY:  History reviewed. No pertinent past medical history.  Past Surgical History:  Procedure Laterality Date  . ABDOMINAL HYSTERECTOMY  2011  . CESAREAN SECTION  2011  . DIc     required intubation an dICU  . hysterectomy (other)     2011 emergent hysterectomy afte rc-sectio complicated hemorrhage    Family History  Problem Relation Age of Onset  . Diabetes Mother   . Hypertension Mother   . Hypertension Maternal Grandmother   . Stroke Paternal Grandfather   . Hypertension Paternal Grandfather   . Thyroid disease Sister   . Cancer Paternal Grandmother 8       ovarian   . Cancer Other   . Stroke Other   . Hypertension Other       ALLERGIES: Patient has no known allergies.  Current Outpatient Medications on File Prior to Visit  Medication Sig Dispense Refill  . Cholecalciferol 50000  units TABS 50,000 units PO qwk for 8 weeks. 8 tablet 4   No current facility-administered medications on file prior to visit.     Social History   Tobacco Use  . Smoking status: Never Smoker  . Smokeless tobacco: Never Used  . Tobacco comment: tobacco use- no   Substance Use Topics  . Alcohol use: No  . Drug use: No    Review of Systems  Constitutional: Negative for chills, fever and unexpected weight change.  HENT: Negative for congestion.   Respiratory: Negative for cough.   Cardiovascular: Negative for chest pain, palpitations and leg swelling.  Gastrointestinal: Negative for nausea and vomiting.  Genitourinary: Negative for vaginal bleeding.  Musculoskeletal: Negative for arthralgias and myalgias.  Skin: Negative for rash.  Neurological: Negative for headaches.  Hematological: Negative for adenopathy.  Psychiatric/Behavioral: Negative for confusion.      Objective:    BP 98/64 (BP Location: Left Arm, Patient Position: Sitting, Cuff Size: Normal)   Pulse 67   Temp 98.1 F (36.7 C) (Oral)   Resp 15   Ht 5' 3.5" (1.613 m)   Wt 111 lb 2 oz (50.4 kg)   SpO2 100%   BMI 19.38 kg/m   BP Readings from Last 3 Encounters:  10/23/17 98/64  10/04/16 104/64  10/03/15 102/66   Wt Readings from Last 3 Encounters:  10/23/17 111 lb 2 oz (50.4 kg)  10/04/16 110 lb 12.8 oz (50.3 kg)  10/03/15  113 lb 9.6 oz (51.5 kg)    Physical Exam  Constitutional: She appears well-developed and well-nourished.  Eyes: Conjunctivae are normal.  Neck: No thyroid mass and no thyromegaly present.  Cardiovascular: Normal rate, regular rhythm, normal heart sounds and normal pulses.  Pulmonary/Chest: Effort normal and breath sounds normal. She has no wheezes. She has no rhonchi. She has no rales. Right breast exhibits no inverted nipple, no mass, no nipple discharge, no skin change and no tenderness. Left breast exhibits no inverted nipple, no mass, no nipple discharge, no skin change and no  tenderness. Breasts are symmetrical.  CBE performed. Breasts are symmetrically dense.   Lymphadenopathy:       Head (right side): No submental, no submandibular, no tonsillar, no preauricular, no posterior auricular and no occipital adenopathy present.       Head (left side): No submental, no submandibular, no tonsillar, no preauricular, no posterior auricular and no occipital adenopathy present.    She has no cervical adenopathy.       Right cervical: No superficial cervical, no deep cervical and no posterior cervical adenopathy present.      Left cervical: No superficial cervical, no deep cervical and no posterior cervical adenopathy present.    She has no axillary adenopathy.  Neurological: She is alert.  Skin: Skin is warm and dry.  Psychiatric: She has a normal mood and affect. Her speech is normal and behavior is normal. Thought content normal.  Vitals reviewed.      Assessment & Plan:   Problem List Items Addressed This Visit      Endocrine   Hypoglycemia    Pending lab evaluation. She will let me know if becomes symptomatic or new concerns arise.       Relevant Orders   C-peptide   Hemoglobin A1c     Other   Routine general medical examination at a health care facility - Primary    CBE performed. She declines baseline mammogram.       Relevant Orders   CBC with Differential/Platelet   Comprehensive metabolic panel   Hemoglobin A1c   Lipid panel   TSH   Vitamin D deficiency    Pending lab      Relevant Orders   VITAMIN D 25 Hydroxy (Vit-D Deficiency, Fractures)   Need for influenza vaccination    Other Visit Diagnoses    Need for immunization against influenza       Relevant Orders   Flu Vaccine QUAD 36+ mos IM (Completed)       I am having Emma Bates maintain her Cholecalciferol.   No orders of the defined types were placed in this encounter.   Return precautions given.   Risks, benefits, and alternatives of the medications and treatment  plan prescribed today were discussed, and patient expressed understanding.   Education regarding symptom management and diagnosis given to patient on AVS.   Continue to follow with Allegra Grana, FNP for routine health maintenance.   Emma Bates and I agreed with plan.   Rennie Plowman, FNP

## 2017-10-23 NOTE — Assessment & Plan Note (Signed)
Pending lab evaluation. She will let me know if becomes symptomatic or new concerns arise.

## 2017-10-23 NOTE — Patient Instructions (Addendum)
Pleasure seeing you always  Health Maintenance, Female Adopting a healthy lifestyle and getting preventive care can go a long way to promote health and wellness. Talk with your health care provider about what schedule of regular examinations is right for you. This is a good chance for you to check in with your provider about disease prevention and staying healthy. In between checkups, there are plenty of things you can do on your own. Experts have done a lot of research about which lifestyle changes and preventive measures are most likely to keep you healthy. Ask your health care provider for more information. Weight and diet Eat a healthy diet  Be sure to include plenty of vegetables, fruits, low-fat dairy products, and lean protein.  Do not eat a lot of foods high in solid fats, added sugars, or salt.  Get regular exercise. This is one of the most important things you can do for your health. ? Most adults should exercise for at least 150 minutes each week. The exercise should increase your heart rate and make you sweat (moderate-intensity exercise). ? Most adults should also do strengthening exercises at least twice a week. This is in addition to the moderate-intensity exercise.  Maintain a healthy weight  Body mass index (BMI) is a measurement that can be used to identify possible weight problems. It estimates body fat based on height and weight. Your health care provider can help determine your BMI and help you achieve or maintain a healthy weight.  For females 15 years of age and older: ? A BMI below 18.5 is considered underweight. ? A BMI of 18.5 to 24.9 is normal. ? A BMI of 25 to 29.9 is considered overweight. ? A BMI of 30 and above is considered obese.  Watch levels of cholesterol and blood lipids  You should start having your blood tested for lipids and cholesterol at 38 years of age, then have this test every 5 years.  You may need to have your cholesterol levels checked  more often if: ? Your lipid or cholesterol levels are high. ? You are older than 38 years of age. ? You are at high risk for heart disease.  Cancer screening Lung Cancer  Lung cancer screening is recommended for adults 28-109 years old who are at high risk for lung cancer because of a history of smoking.  A yearly low-dose CT scan of the lungs is recommended for people who: ? Currently smoke. ? Have quit within the past 15 years. ? Have at least a 30-pack-year history of smoking. A pack year is smoking an average of one pack of cigarettes a day for 1 year.  Yearly screening should continue until it has been 15 years since you quit.  Yearly screening should stop if you develop a health problem that would prevent you from having lung cancer treatment.  Breast Cancer  Practice breast self-awareness. This means understanding how your breasts normally appear and feel.  It also means doing regular breast self-exams. Let your health care provider know about any changes, no matter how small.  If you are in your 20s or 30s, you should have a clinical breast exam (CBE) by a health care provider every 1-3 years as part of a regular health exam.  If you are 45 or older, have a CBE every year. Also consider having a breast X-ray (mammogram) every year.  If you have a family history of breast cancer, talk to your health care provider about genetic screening.  If  you are at high risk for breast cancer, talk to your health care provider about having an MRI and a mammogram every year.  Breast cancer gene (BRCA) assessment is recommended for women who have family members with BRCA-related cancers. BRCA-related cancers include: ? Breast. ? Ovarian. ? Tubal. ? Peritoneal cancers.  Results of the assessment will determine the need for genetic counseling and BRCA1 and BRCA2 testing.  Cervical Cancer Your health care provider may recommend that you be screened regularly for cancer of the pelvic  organs (ovaries, uterus, and vagina). This screening involves a pelvic examination, including checking for microscopic changes to the surface of your cervix (Pap test). You may be encouraged to have this screening done every 3 years, beginning at age 51.  For women ages 11-65, health care providers may recommend pelvic exams and Pap testing every 3 years, or they may recommend the Pap and pelvic exam, combined with testing for human papilloma virus (HPV), every 5 years. Some types of HPV increase your risk of cervical cancer. Testing for HPV may also be done on women of any age with unclear Pap test results.  Other health care providers may not recommend any screening for nonpregnant women who are considered low risk for pelvic cancer and who do not have symptoms. Ask your health care provider if a screening pelvic exam is right for you.  If you have had past treatment for cervical cancer or a condition that could lead to cancer, you need Pap tests and screening for cancer for at least 20 years after your treatment. If Pap tests have been discontinued, your risk factors (such as having a new sexual partner) need to be reassessed to determine if screening should resume. Some women have medical problems that increase the chance of getting cervical cancer. In these cases, your health care provider may recommend more frequent screening and Pap tests.  Colorectal Cancer  This type of cancer can be detected and often prevented.  Routine colorectal cancer screening usually begins at 38 years of age and continues through 38 years of age.  Your health care provider may recommend screening at an earlier age if you have risk factors for colon cancer.  Your health care provider may also recommend using home test kits to check for hidden blood in the stool.  A small camera at the end of a tube can be used to examine your colon directly (sigmoidoscopy or colonoscopy). This is done to check for the earliest forms  of colorectal cancer.  Routine screening usually begins at age 87.  Direct examination of the colon should be repeated every 5-10 years through 38 years of age. However, you may need to be screened more often if early forms of precancerous polyps or small growths are found.  Skin Cancer  Check your skin from head to toe regularly.  Tell your health care provider about any new moles or changes in moles, especially if there is a change in a mole's shape or color.  Also tell your health care provider if you have a mole that is larger than the size of a pencil eraser.  Always use sunscreen. Apply sunscreen liberally and repeatedly throughout the day.  Protect yourself by wearing long sleeves, pants, a wide-brimmed hat, and sunglasses whenever you are outside.  Heart disease, diabetes, and high blood pressure  High blood pressure causes heart disease and increases the risk of stroke. High blood pressure is more likely to develop in: ? People who have blood pressure  in the high end of the normal range (130-139/85-89 mm Hg). ? People who are overweight or obese. ? People who are African American.  If you are 52-50 years of age, have your blood pressure checked every 3-5 years. If you are 13 years of age or older, have your blood pressure checked every year. You should have your blood pressure measured twice-once when you are at a hospital or clinic, and once when you are not at a hospital or clinic. Record the average of the two measurements. To check your blood pressure when you are not at a hospital or clinic, you can use: ? An automated blood pressure machine at a pharmacy. ? A home blood pressure monitor.  If you are between 35 years and 63 years old, ask your health care provider if you should take aspirin to prevent strokes.  Have regular diabetes screenings. This involves taking a blood sample to check your fasting blood sugar level. ? If you are at a normal weight and have a low risk  for diabetes, have this test once every three years after 38 years of age. ? If you are overweight and have a high risk for diabetes, consider being tested at a younger age or more often. Preventing infection Hepatitis B  If you have a higher risk for hepatitis B, you should be screened for this virus. You are considered at high risk for hepatitis B if: ? You were born in a country where hepatitis B is common. Ask your health care provider which countries are considered high risk. ? Your parents were born in a high-risk country, and you have not been immunized against hepatitis B (hepatitis B vaccine). ? You have HIV or AIDS. ? You use needles to inject street drugs. ? You live with someone who has hepatitis B. ? You have had sex with someone who has hepatitis B. ? You get hemodialysis treatment. ? You take certain medicines for conditions, including cancer, organ transplantation, and autoimmune conditions.  Hepatitis C  Blood testing is recommended for: ? Everyone born from [birth year] through 1966. ? Anyone with known risk factors for hepatitis C.  Sexually transmitted infections (STIs)  You should be screened for sexually transmitted infections (STIs) including gonorrhea and chlamydia if: ? You are sexually active and are younger than 38 years of age. ? You are older than 38 years of age and your health care provider tells you that you are at risk for this type of infection. ? Your sexual activity has changed since you were last screened and you are at an increased risk for chlamydia or gonorrhea. Ask your health care provider if you are at risk.  If you do not have HIV, but are at risk, it may be recommended that you take a prescription medicine daily to prevent HIV infection. This is called pre-exposure prophylaxis (PrEP). You are considered at risk if: ? You are sexually active and do not regularly use condoms or know the HIV status of your partner(s). ? You take drugs by  injection. ? You are sexually active with a partner who has HIV.  Talk with your health care provider about whether you are at high risk of being infected with HIV. If you choose to begin PrEP, you should first be tested for HIV. You should then be tested every 3 months for as long as you are taking PrEP. Pregnancy  If you are premenopausal and you may become pregnant, ask your health care provider about preconception counseling.  If you may become pregnant, take 400 to 800 micrograms (mcg) of folic acid every day.  If you want to prevent pregnancy, talk to your health care provider about birth control (contraception). Osteoporosis and menopause  Osteoporosis is a disease in which the bones lose minerals and strength with aging. This can result in serious bone fractures. Your risk for osteoporosis can be identified using a bone density scan.  If you are 17 years of age or older, or if you are at risk for osteoporosis and fractures, ask your health care provider if you should be screened.  Ask your health care provider whether you should take a calcium or vitamin D supplement to lower your risk for osteoporosis.  Menopause may have certain physical symptoms and risks.  Hormone replacement therapy may reduce some of these symptoms and risks. Talk to your health care provider about whether hormone replacement therapy is right for you. Follow these instructions at home:  Schedule regular health, dental, and eye exams.  Stay current with your immunizations.  Do not use any tobacco products including cigarettes, chewing tobacco, or electronic cigarettes.  If you are pregnant, do not drink alcohol.  If you are breastfeeding, limit how much and how often you drink alcohol.  Limit alcohol intake to no more than 1 drink per day for nonpregnant women. One drink equals 12 ounces of beer, 5 ounces of wine, or 1 ounces of hard liquor.  Do not use street drugs.  Do not share needles.  Ask  your health care provider for help if you need support or information about quitting drugs.  Tell your health care provider if you often feel depressed.  Tell your health care provider if you have ever been abused or do not feel safe at home. This information is not intended to replace advice given to you by your health care provider. Make sure you discuss any questions you have with your health care provider. Document Released: 08/21/2010 Document Revised: 07/14/2015 Document Reviewed: 11/09/2014 Elsevier Interactive Patient Education  [employer].

## 2017-10-23 NOTE — Assessment & Plan Note (Signed)
Pending lab 

## 2017-10-23 NOTE — Assessment & Plan Note (Signed)
CBE performed. She declines baseline mammogram.

## 2017-10-25 LAB — C-PEPTIDE: C-Peptide: 0.53 ng/mL — ABNORMAL LOW (ref 0.80–3.85)

## 2017-10-30 ENCOUNTER — Encounter: Payer: Self-pay | Admitting: Family

## 2018-07-17 ENCOUNTER — Other Ambulatory Visit: Payer: Self-pay | Admitting: Family

## 2018-07-17 DIAGNOSIS — E559 Vitamin D deficiency, unspecified: Secondary | ICD-10-CM

## 2018-07-25 ENCOUNTER — Encounter: Payer: Self-pay | Admitting: Family

## 2018-07-25 ENCOUNTER — Other Ambulatory Visit: Payer: Self-pay

## 2018-07-25 DIAGNOSIS — E559 Vitamin D deficiency, unspecified: Secondary | ICD-10-CM

## 2018-07-25 MED ORDER — CHOLECALCIFEROL 1.25 MG (50000 UT) PO TABS: ORAL_TABLET | ORAL | 4 refills | Status: DC

## 2018-11-10 ENCOUNTER — Encounter: Payer: Self-pay | Admitting: Family Medicine

## 2018-11-10 NOTE — Telephone Encounter (Signed)
Please update the chart  You do not have to send all flu vaccine FYIs to me; just be sure chart is updated  Thanks  LG

## 2018-11-14 ENCOUNTER — Ambulatory Visit (INDEPENDENT_AMBULATORY_CARE_PROVIDER_SITE_OTHER): Payer: 59 | Admitting: Family Medicine

## 2018-11-14 ENCOUNTER — Other Ambulatory Visit: Payer: Self-pay

## 2018-11-14 VITALS — BP 88/62 | HR 76 | Temp 96.5°F | Resp 16 | Ht 63.5 in | Wt 112.4 lb

## 2018-11-14 DIAGNOSIS — Z0000 Encounter for general adult medical examination without abnormal findings: Secondary | ICD-10-CM

## 2018-11-14 NOTE — Progress Notes (Signed)
Subjective:    Patient ID: Emma Bates, female    DOB: 09-Mar-1980, 38 y.o.   MRN: [id number]  HPI   Patient presents to clinic for complete physical exam.  Overall she is feeling very well.  Does have a history of vitamin D deficiency and would like this included in her annual lab work.  Does not require Pap smears any longer as she has had a hysterectomy after uterine rupture.  Does self breast exams, does not feel any lumps or abnormalities.  No family history of breast cancer.  Will wait till age 71 for mammogram.  No family history of any colon cancers, can wait till age 45 for colonoscopy.  She does see dentist every 6 months, usually sees eye doctor every 1 to 2 years.   Patient Active Problem List   Diagnosis Date Noted  . Need for influenza vaccination 10/23/2017  . Hypoglycemia 10/23/2017  . Vitamin D deficiency 10/04/2015  . Routine general medical examination at a health care facility 11/12/2013   Social History   Tobacco Use  . Smoking status: Never Smoker  . Smokeless tobacco: Never Used  . Tobacco comment: tobacco use- no   Substance Use Topics  . Alcohol use: No   Family History  Problem Relation Age of Onset  . Diabetes Mother   . Hypertension Mother   . Hypertension Maternal Grandmother   . Stroke Paternal Grandfather   . Hypertension Paternal Grandfather   . Thyroid disease Sister   . Cancer Paternal Grandmother 53       ovarian   . Cancer Other   . Stroke Other   . Hypertension Other    Social History   Tobacco Use  . Smoking status: Never Smoker  . Smokeless tobacco: Never Used  . Tobacco comment: tobacco use- no   Substance Use Topics  . Alcohol use: No   Review of Systems   Constitutional: Negative for chills, fatigue and fever.  HENT: Negative for congestion, ear pain, sinus pain and sore throat.   Eyes: Negative.   Respiratory: Negative for cough, shortness of breath and wheezing.   Cardiovascular: Negative for chest pain,  palpitations and leg swelling.  Gastrointestinal: Negative for abdominal pain, diarrhea, nausea and vomiting.  Genitourinary: Negative for dysuria, frequency and urgency.  Musculoskeletal: Negative for arthralgias and myalgias.  Skin: Negative for color change, pallor and rash.  Neurological: Negative for syncope, light-headedness and headaches.  Psychiatric/Behavioral: The patient is not nervous/anxious.       Objective:   Physical Exam Vitals signs and nursing note reviewed.  Constitutional:      General: She is not in acute distress.    Appearance: She is well-developed. She is not toxic-appearing.  HENT:     Head: Normocephalic and atraumatic.     Right Ear: Tympanic membrane, ear canal and external ear normal.     Left Ear: Tympanic membrane, ear canal and external ear normal.     Nose: Nose normal.  Eyes:     General: No scleral icterus.       Right eye: No discharge.        Left eye: No discharge.     Extraocular Movements: Extraocular movements intact.     Conjunctiva/sclera: Conjunctivae normal.     Pupils: Pupils are equal, round, and reactive to light.  Neck:     Musculoskeletal: Normal range of motion and neck supple.     Trachea: No tracheal deviation.  Cardiovascular:     Rate  and Rhythm: Normal rate and regular rhythm.     Heart sounds: Normal heart sounds. No murmur. No friction rub. No gallop.   Pulmonary:     Effort: Pulmonary effort is normal. No respiratory distress.     Breath sounds: Normal breath sounds. No wheezing or rales.  Chest:     Chest wall: No tenderness.  Abdominal:     General: Bowel sounds are normal.     Palpations: Abdomen is soft.     Tenderness: There is no abdominal tenderness. There is no guarding.  Musculoskeletal: Normal range of motion.        General: No deformity.  Lymphadenopathy:     Cervical: No cervical adenopathy.  Skin:    General: Skin is warm and dry.     Capillary Refill: Capillary refill takes less than 2 seconds.      Coloration: Skin is not pale.     Findings: No erythema.  Neurological:     Mental Status: She is alert and oriented to person, place, and time.     Cranial Nerves: No cranial nerve deficit.  Psychiatric:        Mood and Affect: Mood normal.        Behavior: Behavior normal.        Thought Content: Thought content normal.    Today's Vitals   11/14/18 1535  BP: (!) 88/62  Pulse: 76  Resp: 16  Temp: (!) 96.5 F (35.8 C)  TempSrc: Temporal  SpO2: 99%  Weight: 112 lb 6.4 oz (51 kg)  Height: 5' 3.5" (1.613 m)   Body mass index is 19.6 kg/m.     Assessment & Plan:   Well adult exam - overall patient healthy 38 year old female.  We will get blood work.  Reviewed healthy diet and regular physical activity.  Discussed sun safety, recommended wearing SPF of at least 67 when outdoors for extended period of time.  Patient always wears seatbelt when in car.  Sees dentist and eye doctor regularly.  Patient will follow-up annually for wellness exam and return to clinic whenever needed.

## 2018-11-18 ENCOUNTER — Encounter: Payer: Self-pay | Admitting: Family Medicine

## 2018-11-18 ENCOUNTER — Other Ambulatory Visit (INDEPENDENT_AMBULATORY_CARE_PROVIDER_SITE_OTHER): Payer: 59

## 2018-11-18 ENCOUNTER — Other Ambulatory Visit: Payer: Self-pay

## 2018-11-18 DIAGNOSIS — E538 Deficiency of other specified B group vitamins: Secondary | ICD-10-CM

## 2018-11-18 DIAGNOSIS — Z0000 Encounter for general adult medical examination without abnormal findings: Secondary | ICD-10-CM | POA: Diagnosis not present

## 2018-11-18 LAB — CBC WITH DIFFERENTIAL/PLATELET
Basophils Absolute: 0 10*3/uL (ref 0.0–0.1)
Basophils Relative: 0.3 % (ref 0.0–3.0)
Eosinophils Absolute: 0 10*3/uL (ref 0.0–0.7)
Eosinophils Relative: 0.5 % (ref 0.0–5.0)
HCT: 40 % (ref 36.0–46.0)
Hemoglobin: 13.4 g/dL (ref 12.0–15.0)
Lymphocytes Relative: 25.9 % (ref 12.0–46.0)
Lymphs Abs: 1.4 10*3/uL (ref 0.7–4.0)
MCHC: 33.4 g/dL (ref 30.0–36.0)
MCV: 85.9 fl (ref 78.0–100.0)
Monocytes Absolute: 0.4 10*3/uL (ref 0.1–1.0)
Monocytes Relative: 6.8 % (ref 3.0–12.0)
Neutro Abs: 3.6 10*3/uL (ref 1.4–7.7)
Neutrophils Relative %: 66.5 % (ref 43.0–77.0)
Platelets: 142 10*3/uL — ABNORMAL LOW (ref 150.0–400.0)
RBC: 4.66 Mil/uL (ref 3.87–5.11)
RDW: 13.2 % (ref 11.5–15.5)
WBC: 5.4 10*3/uL (ref 4.0–10.5)

## 2018-11-18 LAB — LIPID PANEL
Cholesterol: 158 mg/dL (ref 0–200)
HDL: 73.7 mg/dL (ref >39.00)
LDL Cholesterol: 72 mg/dL (ref 0–99)
NonHDL: 83.93
Total CHOL/HDL Ratio: 2
Triglycerides: 62 mg/dL (ref 0.0–149.0)
VLDL: 12.4 mg/dL (ref 0.0–40.0)

## 2018-11-18 LAB — B12 AND FOLATE PANEL
Folate: >24.1 ng/mL (ref >5.9)
Vitamin B-12: 180 pg/mL — ABNORMAL LOW (ref 211–911)

## 2018-11-18 LAB — COMPREHENSIVE METABOLIC PANEL
ALT: 12 U/L (ref 0–35)
AST: 18 U/L (ref 0–37)
Albumin: 4.2 g/dL (ref 3.5–5.2)
Alkaline Phosphatase: 67 U/L (ref 39–117)
BUN: 5 mg/dL — ABNORMAL LOW (ref 6–23)
CO2: 29 mEq/L (ref 19–32)
Calcium: 9.5 mg/dL (ref 8.4–10.5)
Chloride: 103 mEq/L (ref 96–112)
Creatinine, Ser: 0.54 mg/dL (ref 0.40–1.20)
GFR: 125.49 mL/min (ref >60.00)
Glucose, Bld: 87 mg/dL (ref 70–99)
Potassium: 4.3 mEq/L (ref 3.5–5.1)
Sodium: 138 mEq/L (ref 135–145)
Total Bilirubin: 0.6 mg/dL (ref 0.2–1.2)
Total Protein: 6.3 g/dL (ref 6.0–8.3)

## 2018-11-18 LAB — VITAMIN D 25 HYDROXY (VIT D DEFICIENCY, FRACTURES): VITD: 44.46 ng/mL (ref 30.00–100.00)

## 2018-11-18 LAB — HEMOGLOBIN A1C: Hgb A1c MFr Bld: 5.5 % (ref 4.6–6.5)

## 2018-11-18 NOTE — Addendum Note (Signed)
Addended by: Leeanne Rio on: 11/18/2018 11:41 AM   Modules accepted: Orders

## 2018-11-19 LAB — THYROID PANEL WITH TSH
Free Thyroxine Index: 2.3 (ref 1.4–3.8)
T3 Uptake: 31 % (ref 22–35)
T4, Total: 7.4 ug/dL (ref 5.1–11.9)
TSH: 1.52 mIU/L

## 2018-11-19 MED ORDER — CYANOCOBALAMIN 500 MCG PO TABS: 500.0000 ug | ORAL_TABLET | Freq: Every day | ORAL | 1 refills | Status: DC

## 2018-11-19 NOTE — Telephone Encounter (Signed)
Scheduled

## 2019-02-11 ENCOUNTER — Other Ambulatory Visit: Payer: 59

## 2019-02-23 ENCOUNTER — Other Ambulatory Visit: Payer: 59

## 2019-10-05 ENCOUNTER — Encounter: Payer: Self-pay | Admitting: Family

## 2019-10-06 ENCOUNTER — Other Ambulatory Visit: Payer: Self-pay | Admitting: Family

## 2019-10-06 DIAGNOSIS — Z0000 Encounter for general adult medical examination without abnormal findings: Secondary | ICD-10-CM

## 2019-10-08 ENCOUNTER — Other Ambulatory Visit: Payer: Self-pay

## 2019-10-09 ENCOUNTER — Encounter: Payer: Self-pay | Admitting: Family

## 2019-10-09 ENCOUNTER — Ambulatory Visit (INDEPENDENT_AMBULATORY_CARE_PROVIDER_SITE_OTHER): Payer: 59 | Admitting: Family

## 2019-10-09 ENCOUNTER — Other Ambulatory Visit: Payer: Self-pay

## 2019-10-09 VITALS — BP 104/70 | HR 91 | Temp 98.6°F | Ht 63.5 in | Wt 114.6 lb

## 2019-10-09 DIAGNOSIS — Z0000 Encounter for general adult medical examination without abnormal findings: Secondary | ICD-10-CM | POA: Diagnosis not present

## 2019-10-09 NOTE — Patient Instructions (Addendum)
( declines printed AVS)   Health Maintenance, Female Adopting a healthy lifestyle and getting preventive care are important in promoting health and wellness. Ask your health care provider about:  The right schedule for you to have regular tests and exams.  Things you can do on your own to prevent diseases and keep yourself healthy. What should I know about diet, weight, and exercise? Eat a healthy diet   Eat a diet that includes plenty of vegetables, fruits, low-fat dairy products, and lean protein.  Do not eat a lot of foods that are high in solid fats, added sugars, or sodium. Maintain a healthy weight Body mass index (BMI) is used to identify weight problems. It estimates body fat based on height and weight. Your health care provider can help determine your BMI and help you achieve or maintain a healthy weight. Get regular exercise Get regular exercise. This is one of the most important things you can do for your health. Most adults should:  Exercise for at least 150 minutes each week. The exercise should increase your heart rate and make you sweat (moderate-intensity exercise).  Do strengthening exercises at least twice a week. This is in addition to the moderate-intensity exercise.  Spend less time sitting. Even light physical activity can be beneficial. Watch cholesterol and blood lipids Have your blood tested for lipids and cholesterol at 40 years of age, then have this test every 5 years. Have your cholesterol levels checked more often if:  Your lipid or cholesterol levels are high.  You are older than 39 years of age.  You are at high risk for heart disease. What should I know about cancer screening? Depending on your health history and family history, you may need to have cancer screening at various ages. This may include screening for:  Breast cancer.  Cervical cancer.  Colorectal cancer.  Skin cancer.  Lung cancer. What should I know about heart disease,  diabetes, and high blood pressure? Blood pressure and heart disease  High blood pressure causes heart disease and increases the risk of stroke. This is more likely to develop in people who have high blood pressure readings, are of African descent, or are overweight.  Have your blood pressure checked: ? Every 3-5 years if you are 32-10 years of age. ? Every year if you are 33 years old or older. Diabetes Have regular diabetes screenings. This checks your fasting blood sugar level. Have the screening done:  Once every three years after age 81 if you are at a normal weight and have a low risk for diabetes.  More often and at a younger age if you are overweight or have a high risk for diabetes. What should I know about preventing infection? Hepatitis B If you have a higher risk for hepatitis B, you should be screened for this virus. Talk with your health care provider to find out if you are at risk for hepatitis B infection. Hepatitis C Testing is recommended for:  Everyone born from [birth year] through 1966.  Anyone with known risk factors for hepatitis C. Sexually transmitted infections (STIs)  Get screened for STIs, including gonorrhea and chlamydia, if: ? You are sexually active and are younger than 40 years of age. ? You are older than 40 years of age and your health care provider tells you that you are at risk for this type of infection. ? Your sexual activity has changed since you were last screened, and you are at increased risk for chlamydia or gonorrhea.  Ask your health care provider if you are at risk.  Ask your health care provider about whether you are at high risk for HIV. Your health care provider may recommend a prescription medicine to help prevent HIV infection. If you choose to take medicine to prevent HIV, you should first get tested for HIV. You should then be tested every 3 months for as long as you are taking the medicine. Pregnancy  If you are about to stop having your  period (premenopausal) and you may become pregnant, seek counseling before you get pregnant.  Take 400 to 800 micrograms (mcg) of folic acid every day if you become pregnant.  Ask for birth control (contraception) if you want to prevent pregnancy. Osteoporosis and menopause Osteoporosis is a disease in which the bones lose minerals and strength with aging. This can result in bone fractures. If you are 52 years old or older, or if you are at risk for osteoporosis and fractures, ask your health care provider if you should:  Be screened for bone loss.  Take a calcium or vitamin D supplement to lower your risk of fractures.  Be given hormone replacement therapy (HRT) to treat symptoms of menopause. Follow these instructions at home: Lifestyle  Do not use any products that contain nicotine or tobacco, such as cigarettes, e-cigarettes, and chewing tobacco. If you need help quitting, ask your health care provider.  Do not use street drugs.  Do not share needles.  Ask your health care provider for help if you need support or information about quitting drugs. Alcohol use  Do not drink alcohol if: ? Your health care provider tells you not to drink. ? You are pregnant, may be pregnant, or are planning to become pregnant.  If you drink alcohol: ? Limit how much you use to 0-1 drink a day. ? Limit intake if you are breastfeeding.  Be aware of how much alcohol is in your drink. In the [place] one drink equals one 12 oz bottle of beer (355 mL), one 5 oz glass of wine (148 mL), or one 1 oz glass of hard liquor (44 mL). General instructions  Schedule regular health, dental, and eye exams.  Stay current with your vaccines.  Tell your health care provider if: ? You often feel depressed. ? You have ever been abused or do not feel safe at home. Summary  Adopting a healthy lifestyle and getting preventive care are important in promoting health and wellness.  Follow your health care provider's  instructions about healthy diet, exercising, and getting tested or screened for diseases.  Follow your health care provider's instructions on monitoring your cholesterol and blood pressure. This information is not intended to replace advice given to you by your health care provider. Make sure you discuss any questions you have with your health care provider. Document Revised: 01/29/2018 Document Reviewed: 01/29/2018 Elsevier Patient Education  2020 [employer].

## 2019-10-09 NOTE — Progress Notes (Signed)
Subjective:    Patient ID: Emma Bates, female    DOB: Jun 23, 1980, 39 y.o.   MRN: [id number]  CC: Emma Bates is a 39 y.o. female who presents today for physical exam.    HPI: Feels well today No complains.  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 This works better for her family and caring for her daughter. She is doing well and denies physican burn out. She has been coping well during pandemic.   Colorectal Cancer Screening: No early family history Breast Cancer Screening: Mammogram due Cervical Cancer Screening: h/o hysterectomy after hemorrhage. She doesn't have a cervix. No h/o GYN cancer. No vaginal bleeding or pelvic pain.         Tetanus - utd         Labs: Screening labs today. Exercise: Gets regular exercise, walking dog, yoga Alcohol use: none Smoking/tobacco use: Nonsmoker.    HISTORY:  History reviewed. No pertinent past medical history.  Past Surgical History:  Procedure Laterality Date   ABDOMINAL HYSTERECTOMY  2011   CESAREAN SECTION  2011   DIc     required intubation an dICU   hysterectomy (other)     2011 emergent hysterectomy afte rc-sectio complicated hemorrhage    Family History  Problem Relation Age of Onset   Diabetes Mother    Hypertension Mother    Hypertension Maternal Grandmother    Stroke Paternal Grandfather    Hypertension Paternal Grandfather    Thyroid disease Sister    Cancer Paternal Grandmother 7       ovarian    Cancer Other    Stroke Other    Hypertension Other       ALLERGIES: Patient has no known allergies.  Current Outpatient Medications on File Prior to Visit  Medication Sig Dispense Refill   Cholecalciferol 1.25 MG (50000 UT) TABS 50,000 units PO qwk for 8 weeks. 8 tablet 4   No current facility-administered medications on file prior to visit.    Social History   Tobacco Use   Smoking status: Never Smoker   Smokeless tobacco: Never Used   Tobacco  comment: tobacco use- no   Substance Use Topics   Alcohol use: No   Drug use: No    Review of Systems  Constitutional: Negative for chills, fatigue, fever and unexpected weight change.  HENT: Negative for congestion.   Respiratory: Negative for cough.   Cardiovascular: Negative for chest pain, palpitations and leg swelling.  Gastrointestinal: Negative for nausea and vomiting.  Musculoskeletal: Negative for arthralgias and myalgias.  Skin: Negative for rash.  Neurological: Negative for headaches.  Hematological: Negative for adenopathy.  Psychiatric/Behavioral: Negative for confusion.      Objective:    BP 104/70 (BP Location: Left Arm, Patient Position: Sitting)    Pulse 91    Temp 98.6 F (37 C)    Ht 5' 3.5" (1.613 m)    Wt 114 lb 9.6 oz (52 kg)    SpO2 99%    BMI 19.98 kg/m   BP Readings from Last 3 Encounters:  10/09/19 104/70  11/14/18 (!) 88/62  10/23/17 98/64   Wt Readings from Last 3 Encounters:  10/09/19 114 lb 9.6 oz (52 kg)  11/14/18 112 lb 6.4 oz (51 kg)  10/23/17 111 lb 2 oz (50.4 kg)    Physical Exam Vitals reviewed.  Constitutional:      Appearance: She is well-developed.  Eyes:     Conjunctiva/sclera: Conjunctivae normal.  Neck:     Thyroid:  No thyroid mass or thyromegaly.  Cardiovascular:     Rate and Rhythm: Normal rate and regular rhythm.     Pulses: Normal pulses.     Heart sounds: Normal heart sounds.  Pulmonary:     Effort: Pulmonary effort is normal.     Breath sounds: Normal breath sounds. No wheezing, rhonchi or rales.  Lymphadenopathy:     Head:     Right side of head: No submental, submandibular, tonsillar, preauricular, posterior auricular or occipital adenopathy.     Left side of head: No submental, submandibular, tonsillar, preauricular, posterior auricular or occipital adenopathy.     Cervical: No cervical adenopathy.  Skin:    General: Skin is warm and dry.  Neurological:     Mental Status: She is alert.  Psychiatric:         Speech: Speech normal.        Behavior: Behavior normal.        Thought Content: Thought content normal.        Assessment & Plan:   Problem List Items Addressed This Visit      Other   Routine general medical examination at a health care facility - Primary    Declines pelvic exam and clinical breast exam in the absence of complaints. Encouraged continue exercise with yoga. Patient will schedule mammogram and it has been ordered.       Relevant Orders   B12 and Folate Panel   CBC with Differential/Platelet   Comprehensive metabolic panel   HgB A1c   Lipid panel   TSH   Vitamin D (25 hydroxy)   MM 3D SCREEN BREAST BILATERAL       I have discontinued Loretta Maret's vitamin B-12. I am also having her maintain her Cholecalciferol.   No orders of the defined types were placed in this encounter.   Return precautions given.   Risks, benefits, and alternatives of the medications and treatment plan prescribed today were discussed, and patient expressed understanding.   Education regarding symptom management and diagnosis given to patient on AVS.   Continue to follow with Allegra Grana, FNP for routine health maintenance.   Emma Bates and I agreed with plan.   Rennie Plowman, FNP

## 2019-10-09 NOTE — Assessment & Plan Note (Addendum)
Declines pelvic exam and clinical breast exam in the absence of complaints. Encouraged continue exercise with yoga. Patient will schedule mammogram and it has been ordered.

## 2019-10-10 LAB — COMPREHENSIVE METABOLIC PANEL
AG Ratio: 2 (calc) (ref 1.0–2.5)
ALT: 13 U/L (ref 6–29)
AST: 20 U/L (ref 10–30)
Albumin: 4.5 g/dL (ref 3.6–5.1)
Alkaline phosphatase (APISO): 65 U/L (ref 31–125)
BUN: 10 mg/dL (ref 7–25)
CO2: 26 mmol/L (ref 20–32)
Calcium: 9.5 mg/dL (ref 8.6–10.2)
Chloride: 103 mmol/L (ref 98–110)
Creat: 0.62 mg/dL (ref 0.50–1.10)
Globulin: 2.3 g/dL (calc) (ref 1.9–3.7)
Glucose, Bld: 85 mg/dL (ref 65–99)
Potassium: 4.5 mmol/L (ref 3.5–5.3)
Sodium: 138 mmol/L (ref 135–146)
Total Bilirubin: 0.8 mg/dL (ref 0.2–1.2)
Total Protein: 6.8 g/dL (ref 6.1–8.1)

## 2019-10-10 LAB — CBC WITH DIFFERENTIAL/PLATELET
Absolute Monocytes: 452 cells/uL (ref 200–950)
Basophils Absolute: 12 cells/uL (ref 0–200)
Basophils Relative: 0.2 %
Eosinophils Absolute: 17 cells/uL (ref 15–500)
Eosinophils Relative: 0.3 %
HCT: 41.4 % (ref 35.0–45.0)
Hemoglobin: 13.7 g/dL (ref 11.7–15.5)
Lymphs Abs: 1363 cells/uL (ref 850–3900)
MCH: 28.7 pg (ref 27.0–33.0)
MCHC: 33.1 g/dL (ref 32.0–36.0)
MCV: 86.6 fL (ref 80.0–100.0)
MPV: 12.1 fL (ref 7.5–12.5)
Monocytes Relative: 7.8 %
Neutro Abs: 3956 cells/uL (ref 1500–7800)
Neutrophils Relative %: 68.2 %
Platelets: 141 10*3/uL (ref 140–400)
RBC: 4.78 10*6/uL (ref 3.80–5.10)
RDW: 12.8 % (ref 11.0–15.0)
Total Lymphocyte: 23.5 %
WBC: 5.8 10*3/uL (ref 3.8–10.8)

## 2019-10-10 LAB — HEMOGLOBIN A1C
Hgb A1c MFr Bld: 5.3 % of total Hgb (ref <5.7)
Mean Plasma Glucose: 105 (calc)
eAG (mmol/L): 5.8 (calc)

## 2019-10-10 LAB — B12 AND FOLATE PANEL
Folate: 22.8 ng/mL
Vitamin B-12: 559 pg/mL (ref 200–1100)

## 2019-10-10 LAB — LIPID PANEL
Cholesterol: 176 mg/dL (ref <200)
HDL: 72 mg/dL (ref >=50)
LDL Cholesterol (Calc): 89 mg/dL (calc)
Non-HDL Cholesterol (Calc): 104 mg/dL (calc) (ref <130)
Total CHOL/HDL Ratio: 2.4 (calc) (ref <5.0)
Triglycerides: 60 mg/dL (ref <150)

## 2019-10-10 LAB — TSH: TSH: 1.49 mIU/L

## 2019-10-10 LAB — VITAMIN D 25 HYDROXY (VIT D DEFICIENCY, FRACTURES): Vit D, 25-Hydroxy: 42 ng/mL (ref 30–100)

## 2019-11-11 ENCOUNTER — Encounter: Payer: Self-pay | Admitting: Family

## 2019-12-16 ENCOUNTER — Encounter: Payer: Self-pay | Admitting: Family

## 2019-12-17 ENCOUNTER — Telehealth: Payer: Self-pay | Admitting: Family

## 2019-12-17 NOTE — Telephone Encounter (Signed)
Patient dropped off a  Physician Results form to be filled out by [employer]. Jason Coop is out of the office next day form will be handed to Maralyn Sago. Please fax when completed and call patient.

## 2019-12-21 NOTE — Telephone Encounter (Signed)
I have filled out & placed in your red folder.

## 2019-12-22 ENCOUNTER — Encounter: Payer: Self-pay | Admitting: Family

## 2020-03-22 ENCOUNTER — Other Ambulatory Visit: Payer: Self-pay | Admitting: Family

## 2020-03-22 DIAGNOSIS — Z1231 Encounter for screening mammogram for malignant neoplasm of breast: Secondary | ICD-10-CM

## 2020-04-29 ENCOUNTER — Ambulatory Visit
Admission: RE | Admit: 2020-04-29 | Discharge: 2020-04-29 | Disposition: A | Discharge status: Home / Self Care | Payer: No Typology Code available for payment source | Source: Ambulatory Visit | Attending: Family | Admitting: Family

## 2020-04-29 ENCOUNTER — Other Ambulatory Visit: Payer: Self-pay

## 2020-04-29 DIAGNOSIS — Z1231 Encounter for screening mammogram for malignant neoplasm of breast: Secondary | ICD-10-CM | POA: Insufficient documentation

## 2020-07-14 ENCOUNTER — Encounter: Payer: Self-pay | Admitting: Family

## 2020-07-21 ENCOUNTER — Encounter: Payer: Self-pay | Admitting: Family

## 2020-09-02 ENCOUNTER — Encounter: Payer: No Typology Code available for payment source | Admitting: Family

## 2020-09-23 ENCOUNTER — Encounter: Payer: Self-pay | Admitting: Family

## 2020-10-14 ENCOUNTER — Encounter: Payer: No Typology Code available for payment source | Admitting: Family

## 2020-10-18 NOTE — Telephone Encounter (Signed)
Labs scheduled  

## 2020-11-15 ENCOUNTER — Telehealth: Payer: Self-pay | Admitting: Family

## 2020-11-15 DIAGNOSIS — Z0000 Encounter for general adult medical examination without abnormal findings: Secondary | ICD-10-CM

## 2020-11-15 NOTE — Telephone Encounter (Signed)
LMTCB

## 2020-11-15 NOTE — Telephone Encounter (Signed)
Pt has a lab appt 9/30, there is No lab Orders in yet.

## 2020-11-15 NOTE — Addendum Note (Signed)
Addended by: Allegra Grana on: 11/15/2020 12:21 PM   Modules accepted: Orders

## 2020-11-15 NOTE — Telephone Encounter (Signed)
Call patient please inform her that I have ordered upcoming CPE and labs including fasting labs.  Please also let her know that as she has a history of B12 deficiency, I reviewed her chart I do not see that she was ever screened for pernicious anemia.  If Okay with her, please let her know that I included screening labs for pernicious Anemia as well as celiac disease.  I screen for both of these with a history of low B12

## 2020-11-15 NOTE — Telephone Encounter (Signed)
May be sending this to you twice  But wanted make sure that phone note in regards  pernicious anemia labs was sent to you  See below

## 2020-11-18 ENCOUNTER — Telehealth: Payer: Self-pay | Admitting: Family

## 2020-11-18 ENCOUNTER — Ambulatory Visit (INDEPENDENT_AMBULATORY_CARE_PROVIDER_SITE_OTHER): Payer: No Typology Code available for payment source | Admitting: Family

## 2020-11-18 ENCOUNTER — Other Ambulatory Visit (INDEPENDENT_AMBULATORY_CARE_PROVIDER_SITE_OTHER): Payer: No Typology Code available for payment source

## 2020-11-18 ENCOUNTER — Other Ambulatory Visit: Payer: Self-pay

## 2020-11-18 ENCOUNTER — Encounter: Payer: Self-pay | Admitting: Family

## 2020-11-18 VITALS — BP 92/62 | HR 62 | Temp 98.0°F | Ht 63.5 in | Wt 115.8 lb

## 2020-11-18 DIAGNOSIS — Z0000 Encounter for general adult medical examination without abnormal findings: Secondary | ICD-10-CM | POA: Diagnosis not present

## 2020-11-18 LAB — COMPREHENSIVE METABOLIC PANEL
ALT: 16 U/L (ref 0–35)
AST: 20 U/L (ref 0–37)
Albumin: 4.3 g/dL (ref 3.5–5.2)
Alkaline Phosphatase: 66 U/L (ref 39–117)
BUN: 8 mg/dL (ref 6–23)
CO2: 30 mEq/L (ref 19–32)
Calcium: 9.5 mg/dL (ref 8.4–10.5)
Chloride: 103 mEq/L (ref 96–112)
Creatinine, Ser: 0.55 mg/dL (ref 0.40–1.20)
GFR: 113.96 mL/min (ref >60.00)
Glucose, Bld: 86 mg/dL (ref 70–99)
Potassium: 5 mEq/L (ref 3.5–5.1)
Sodium: 139 mEq/L (ref 135–145)
Total Bilirubin: 0.6 mg/dL (ref 0.2–1.2)
Total Protein: 6.5 g/dL (ref 6.0–8.3)

## 2020-11-18 LAB — TSH: TSH: 2.33 u[IU]/mL (ref 0.35–5.50)

## 2020-11-18 LAB — CBC WITH DIFFERENTIAL/PLATELET
Basophils Absolute: 0 10*3/uL (ref 0.0–0.1)
Basophils Relative: 0.2 % (ref 0.0–3.0)
Eosinophils Absolute: 0 10*3/uL (ref 0.0–0.7)
Eosinophils Relative: 0.9 % (ref 0.0–5.0)
HCT: 41.1 % (ref 36.0–46.0)
Hemoglobin: 13.7 g/dL (ref 12.0–15.0)
Lymphocytes Relative: 26.1 % (ref 12.0–46.0)
Lymphs Abs: 1.2 10*3/uL (ref 0.7–4.0)
MCHC: 33.3 g/dL (ref 30.0–36.0)
MCV: 85.7 fl (ref 78.0–100.0)
Monocytes Absolute: 0.3 10*3/uL (ref 0.1–1.0)
Monocytes Relative: 7.5 % (ref 3.0–12.0)
Neutro Abs: 3 10*3/uL (ref 1.4–7.7)
Neutrophils Relative %: 65.3 % (ref 43.0–77.0)
Platelets: 151 10*3/uL (ref 150.0–400.0)
RBC: 4.79 Mil/uL (ref 3.87–5.11)
RDW: 13.5 % (ref 11.5–15.5)
WBC: 4.7 10*3/uL (ref 4.0–10.5)

## 2020-11-18 LAB — LIPID PANEL
Cholesterol: 166 mg/dL (ref 0–200)
HDL: 72 mg/dL (ref >39.00)
LDL Cholesterol: 82 mg/dL (ref 0–99)
NonHDL: 94.23
Total CHOL/HDL Ratio: 2
Triglycerides: 63 mg/dL (ref 0.0–149.0)
VLDL: 12.6 mg/dL (ref 0.0–40.0)

## 2020-11-18 LAB — B12 AND FOLATE PANEL
Folate: >23.4 ng/mL (ref >5.9)
Vitamin B-12: 297 pg/mL (ref 211–911)

## 2020-11-18 LAB — VITAMIN D 25 HYDROXY (VIT D DEFICIENCY, FRACTURES): VITD: 24.86 ng/mL — ABNORMAL LOW (ref 30.00–100.00)

## 2020-11-18 LAB — HEMOGLOBIN A1C: Hgb A1c MFr Bld: 5.5 % (ref 4.6–6.5)

## 2020-11-18 NOTE — Assessment & Plan Note (Signed)
Patient politely declines breast exam, pelvic exam in the absence of complaints and she does not have a cervix.  She is no longer screening for cervical cancer . encouraged continued self breast exam at home .  Encouraged continued exercise.

## 2020-11-18 NOTE — Progress Notes (Signed)
Subjective:    Patient ID: Emma Bates, female    DOB: [birth year], 40 y.o.   MRN: [id number]  CC: Emma Bates is a 40 y.o. female who presents today for physical exam.    HPI: Feels well today.  No complaints.   Colorectal Cancer Screening: No early family history Breast Cancer Screening: Mammogram UTD Cervical Cancer Screening: History of hysterectomy after hemorrhage.  She does not have a cervix.  No history of GYN cancer.  No vaginal bleeding or pelvic pain         Tetanus - UTD    Labs: Screening labs today. Exercise: Gets regular exercise, yoga.   Alcohol use: none Smoking/tobacco use: Nonsmoker.     HISTORY:  History reviewed. No pertinent past medical history.  Past Surgical History:  Procedure Laterality Date   ABDOMINAL HYSTERECTOMY  02/19/2009   History of hysterectomy after hemorrhage.  She does not have a cervix.  No history of GYN cancer.   CESAREAN SECTION  02/19/2009   DIc     required intubation an dICU   hysterectomy (other)     2011 emergent hysterectomy afte rc-sectio complicated hemorrhage    Family History  Problem Relation Age of Onset   Diabetes Mother    Hypertension Mother    Hypertension Maternal Grandmother    Stroke Paternal Grandfather    Hypertension Paternal Grandfather    Thyroid disease Sister    Cancer Paternal Grandmother 17       ovarian    Cancer Other    Stroke Other    Hypertension Other       ALLERGIES: Patient has no known allergies.  No current outpatient medications on file prior to visit.   No current facility-administered medications on file prior to visit.    Social History   Tobacco Use   Smoking status: Never   Smokeless tobacco: Never   Tobacco comments:    tobacco use- no   Substance Use Topics   Alcohol use: No   Drug use: No    Review of Systems  Constitutional:  Negative for chills, fever and unexpected weight change.  HENT:  Negative for congestion.   Respiratory:  Negative for  cough.   Cardiovascular:  Negative for chest pain, palpitations and leg swelling.  Gastrointestinal:  Negative for abdominal distention, nausea and vomiting.  Genitourinary:  Negative for pelvic pain.  Musculoskeletal:  Negative for arthralgias and myalgias.  Skin:  Negative for rash.  Neurological:  Negative for headaches.  Hematological:  Negative for adenopathy.  Psychiatric/Behavioral:  Negative for confusion.      Objective:    BP 92/62 (BP Location: Left Arm, Patient Position: Sitting, Cuff Size: Normal)   Pulse 62   Temp 98 F (36.7 C) (Oral)   Ht 5' 3.5" (1.613 m)   Wt 115 lb 12.8 oz (52.5 kg)   SpO2 99%   BMI 20.19 kg/m   BP Readings from Last 3 Encounters:  11/18/20 92/62  10/09/19 104/70  11/14/18 (!) 88/62   Wt Readings from Last 3 Encounters:  11/18/20 115 lb 12.8 oz (52.5 kg)  10/09/19 114 lb 9.6 oz (52 kg)  11/14/18 112 lb 6.4 oz (51 kg)    Physical Exam Vitals reviewed.  Constitutional:      Appearance: Normal appearance. She is well-developed.  Eyes:     Conjunctiva/sclera: Conjunctivae normal.  Neck:     Thyroid: No thyroid mass or thyromegaly.  Cardiovascular:     Rate and Rhythm: Normal rate  and regular rhythm.     Pulses: Normal pulses.     Heart sounds: Normal heart sounds.  Pulmonary:     Effort: Pulmonary effort is normal.     Breath sounds: Normal breath sounds. No wheezing, rhonchi or rales.  Abdominal:     General: Bowel sounds are normal. There is no distension.     Palpations: Abdomen is soft. Abdomen is not rigid. There is no fluid wave or mass.     Tenderness: There is no abdominal tenderness. There is no guarding or rebound.  Lymphadenopathy:     Head:     Right side of head: No submental, submandibular, tonsillar, preauricular, posterior auricular or occipital adenopathy.     Left side of head: No submental, submandibular, tonsillar, preauricular, posterior auricular or occipital adenopathy.     Cervical: No cervical adenopathy.   Skin:    General: Skin is warm and dry.  Neurological:     Mental Status: She is alert.  Psychiatric:        Speech: Speech normal.        Behavior: Behavior normal.        Thought Content: Thought content normal.       Assessment & Plan:   Problem List Items Addressed This Visit       Other   Routine general medical examination at a health care facility - Primary    Patient politely declines breast exam, pelvic exam in the absence of complaints and she does not have a cervix.  She is no longer screening for cervical cancer . encouraged continued self breast exam at home .  Encouraged continued exercise.      Relevant Orders   HgB A1c     I have discontinued Marcene Rizor's Cholecalciferol.   No orders of the defined types were placed in this encounter.   Return precautions given.   Risks, benefits, and alternatives of the medications and treatment plan prescribed today were discussed, and patient expressed understanding.   Education regarding symptom management and diagnosis given to patient on AVS.   Continue to follow with Allegra Grana, FNP for routine health maintenance.   Enid Baas and I agreed with plan.   Rennie Plowman, FNP

## 2020-11-18 NOTE — Telephone Encounter (Signed)
Can we add on a1c today's labs?

## 2020-11-18 NOTE — Telephone Encounter (Signed)
A1C was added

## 2020-11-21 ENCOUNTER — Other Ambulatory Visit: Payer: Self-pay | Admitting: Family

## 2020-11-21 DIAGNOSIS — E559 Vitamin D deficiency, unspecified: Secondary | ICD-10-CM

## 2020-11-21 MED ORDER — CHOLECALCIFEROL 1.25 MG (50000 UT) PO TABS
ORAL_TABLET | ORAL | 0 refills | Status: AC
Start: 2020-11-21 — End: ?

## 2020-11-22 LAB — CELIAC DISEASE AB SCREEN W/RFX
Antigliadin Abs, IgA: 3 units (ref 0–19)
IgA/Immunoglobulin A, Serum: 203 mg/dL (ref 87–352)
Transglutaminase IgA: <2 U/mL (ref 0–3)

## 2020-11-22 LAB — METHYLMALONIC ACID, SERUM: Methylmalonic Acid, Quant: 116 nmol/L (ref 87–318)

## 2020-11-22 LAB — INTRINSIC FACTOR ANTIBODIES: Intrinsic Factor: NEGATIVE

## 2020-11-22 LAB — HOMOCYSTEINE: Homocysteine: 5.7 umol/L (ref <10.4)

## 2020-11-22 LAB — ANTI-PARIETAL ANTIBODY: PARIETAL CELL AB SCREEN: NEGATIVE

## 2021-05-01 ENCOUNTER — Telehealth: Payer: Self-pay | Admitting: Family

## 2021-05-01 ENCOUNTER — Other Ambulatory Visit: Payer: Self-pay | Admitting: Adult Health Nurse Practitioner

## 2021-05-01 ENCOUNTER — Other Ambulatory Visit: Payer: Self-pay | Admitting: Family

## 2021-05-01 DIAGNOSIS — Z1231 Encounter for screening mammogram for malignant neoplasm of breast: Secondary | ICD-10-CM

## 2021-05-01 NOTE — Telephone Encounter (Signed)
Pt sent a mychart message requesting for lab orders. No lab orders in system. Pt is requesting for CBC, CMP, A1c, TSH, vit D, lipid panel before annual physical. Pt is schedule for physical on 11/24/2021 at 3:30pm. Pt requesting callback  ?

## 2021-05-04 ENCOUNTER — Telehealth: Payer: Self-pay

## 2021-05-04 ENCOUNTER — Other Ambulatory Visit: Payer: Self-pay

## 2021-05-04 NOTE — Telephone Encounter (Signed)
Lmtcb to inform patient that labs have been ordered prior to her Oct appointment ?

## 2021-05-04 NOTE — Telephone Encounter (Signed)
Lmtcb to inform patient that labs have been ordered prior to her Oct appointment ?

## 2021-05-04 NOTE — Telephone Encounter (Signed)
Labs are already ordered for future. Patient can get them done before her physical in October of 2023 ?

## 2021-05-19 ENCOUNTER — Ambulatory Visit
Admission: RE | Admit: 2021-05-19 | Discharge: 2021-05-19 | Disposition: A | Discharge status: Home / Self Care | Payer: No Typology Code available for payment source | Source: Ambulatory Visit | Attending: Family | Admitting: Family

## 2021-05-19 DIAGNOSIS — Z1231 Encounter for screening mammogram for malignant neoplasm of breast: Secondary | ICD-10-CM | POA: Diagnosis present

## 2021-05-22 ENCOUNTER — Other Ambulatory Visit: Payer: Self-pay | Admitting: Family

## 2021-05-22 DIAGNOSIS — R928 Other abnormal and inconclusive findings on diagnostic imaging of breast: Secondary | ICD-10-CM

## 2021-06-08 ENCOUNTER — Ambulatory Visit
Admission: RE | Admit: 2021-06-08 | Discharge: 2021-06-08 | Disposition: A | Discharge status: Home / Self Care | Payer: No Typology Code available for payment source | Source: Ambulatory Visit | Attending: Family | Admitting: Family

## 2021-06-08 DIAGNOSIS — R928 Other abnormal and inconclusive findings on diagnostic imaging of breast: Secondary | ICD-10-CM | POA: Diagnosis present

## 2021-11-03 ENCOUNTER — Other Ambulatory Visit: Payer: Self-pay | Admitting: Physician Assistant

## 2021-11-03 DIAGNOSIS — Z0000 Encounter for general adult medical examination without abnormal findings: Secondary | ICD-10-CM

## 2021-11-03 DIAGNOSIS — Z136 Encounter for screening for cardiovascular disorders: Secondary | ICD-10-CM

## 2021-11-09 ENCOUNTER — Ambulatory Visit
Admission: RE | Admit: 2021-11-09 | Discharge: 2021-11-09 | Disposition: A | Discharge status: Home / Self Care | Payer: No Typology Code available for payment source | Source: Ambulatory Visit | Attending: Physician Assistant | Admitting: Physician Assistant

## 2021-11-09 DIAGNOSIS — Z136 Encounter for screening for cardiovascular disorders: Secondary | ICD-10-CM

## 2021-11-09 DIAGNOSIS — Z0000 Encounter for general adult medical examination without abnormal findings: Secondary | ICD-10-CM

## 2021-11-24 ENCOUNTER — Encounter: Payer: No Typology Code available for payment source | Admitting: Family

## 2021-11-28 ENCOUNTER — Other Ambulatory Visit: Payer: No Typology Code available for payment source

## 2021-12-08 ENCOUNTER — Encounter: Payer: No Typology Code available for payment source | Admitting: Family

## 2022-04-27 ENCOUNTER — Other Ambulatory Visit: Payer: Self-pay | Admitting: Physician Assistant

## 2022-04-27 DIAGNOSIS — Z1231 Encounter for screening mammogram for malignant neoplasm of breast: Secondary | ICD-10-CM

## 2022-05-31 ENCOUNTER — Ambulatory Visit
Admission: RE | Admit: 2022-05-31 | Discharge: 2022-05-31 | Disposition: A | Discharge status: Home / Self Care | Payer: BC Managed Care – PPO | Source: Ambulatory Visit | Attending: Physician Assistant | Admitting: Physician Assistant

## 2022-05-31 DIAGNOSIS — Z1231 Encounter for screening mammogram for malignant neoplasm of breast: Secondary | ICD-10-CM | POA: Insufficient documentation

## 2022-11-19 ENCOUNTER — Other Ambulatory Visit: Payer: Self-pay | Admitting: Physician Assistant

## 2022-11-19 ENCOUNTER — Encounter: Payer: Self-pay | Admitting: Physician Assistant

## 2022-11-19 DIAGNOSIS — R918 Other nonspecific abnormal finding of lung field: Secondary | ICD-10-CM

## 2022-11-19 DIAGNOSIS — Z0000 Encounter for general adult medical examination without abnormal findings: Secondary | ICD-10-CM

## 2022-11-29 ENCOUNTER — Ambulatory Visit
Admission: RE | Admit: 2022-11-29 | Discharge: 2022-11-29 | Disposition: A | Discharge status: Home / Self Care | Payer: BC Managed Care – PPO | Source: Ambulatory Visit | Attending: Physician Assistant | Admitting: Physician Assistant

## 2022-11-29 DIAGNOSIS — Z0000 Encounter for general adult medical examination without abnormal findings: Secondary | ICD-10-CM | POA: Diagnosis present

## 2022-11-29 DIAGNOSIS — R918 Other nonspecific abnormal finding of lung field: Secondary | ICD-10-CM | POA: Diagnosis present

## 2023-05-10 ENCOUNTER — Other Ambulatory Visit: Payer: Self-pay | Admitting: Physician Assistant

## 2023-05-10 DIAGNOSIS — Z1231 Encounter for screening mammogram for malignant neoplasm of breast: Secondary | ICD-10-CM

## 2023-06-07 ENCOUNTER — Ambulatory Visit
Admission: RE | Admit: 2023-06-07 | Discharge: 2023-06-07 | Disposition: A | Discharge status: Home / Self Care | Source: Ambulatory Visit | Attending: Physician Assistant | Admitting: Physician Assistant

## 2023-06-07 DIAGNOSIS — Z1231 Encounter for screening mammogram for malignant neoplasm of breast: Secondary | ICD-10-CM

## 2023-06-13 ENCOUNTER — Other Ambulatory Visit: Payer: Self-pay | Admitting: Physician Assistant

## 2023-06-13 DIAGNOSIS — R928 Other abnormal and inconclusive findings on diagnostic imaging of breast: Secondary | ICD-10-CM

## 2023-06-27 ENCOUNTER — Ambulatory Visit
Admission: RE | Admit: 2023-06-27 | Discharge: 2023-06-27 | Disposition: A | Discharge status: Home / Self Care | Source: Ambulatory Visit | Attending: Physician Assistant | Admitting: Physician Assistant

## 2023-06-27 DIAGNOSIS — R928 Other abnormal and inconclusive findings on diagnostic imaging of breast: Secondary | ICD-10-CM

## 2023-09-13 IMAGING — MG MM DIGITAL DIAGNOSTIC UNILAT*R* W/ TOMO W/ CAD
6 series · 6 of 18 positions shown · non-contrast
Comparison: Previous exam(s).

CLINICAL DATA: Possible RIGHT breast distortion seen on CC view
only

EXAM:
DIGITAL DIAGNOSTIC UNILATERAL RIGHT MAMMOGRAM WITH TOMOSYNTHESIS AND
CAD; ULTRASOUND RIGHT BREAST LIMITED
TECHNIQUE: Right digital diagnostic mammography and breast tomosynthesis was
performed. The images were evaluated with computer-aided detection.;
Targeted ultrasound examination of the right breast was performed

[R CC synth-2D (1 of 2)]
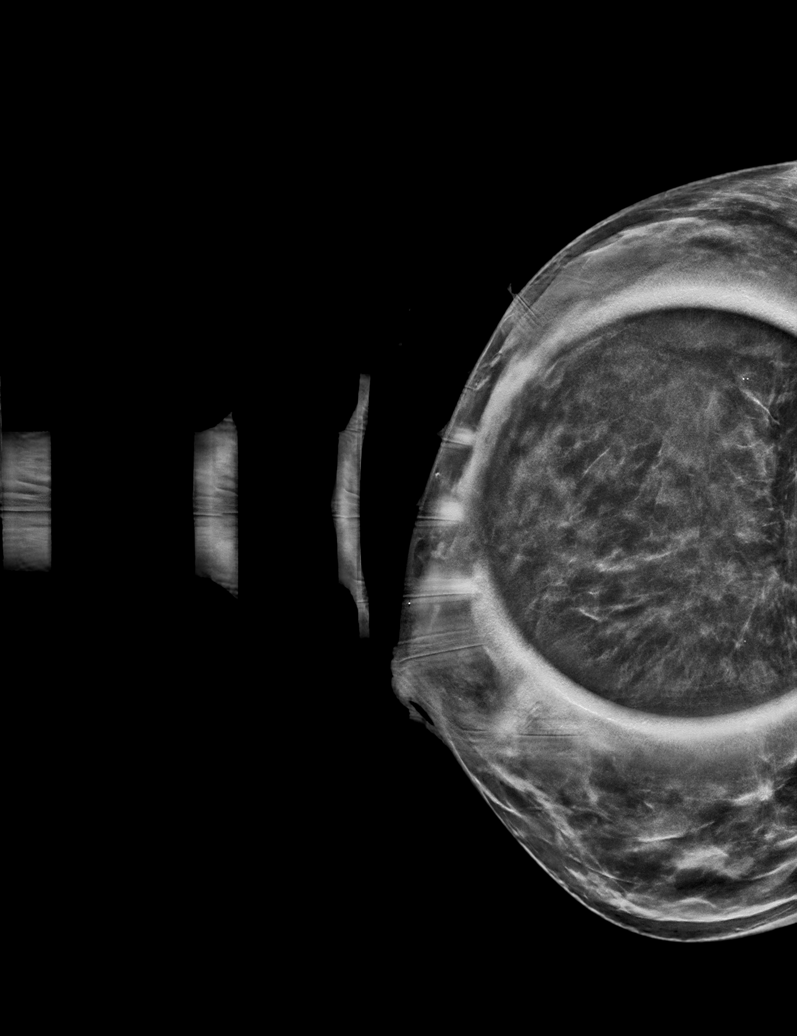

[R ML synth-2D]
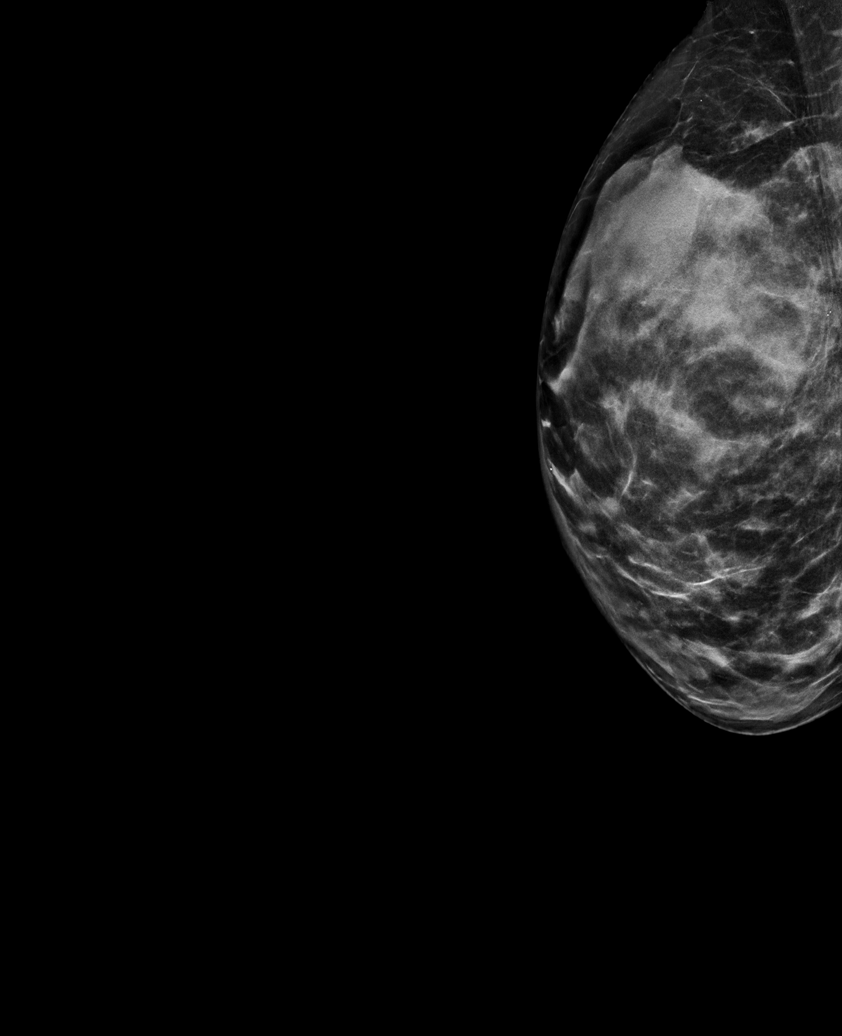

[R CC synth-2D (2 of 2)]
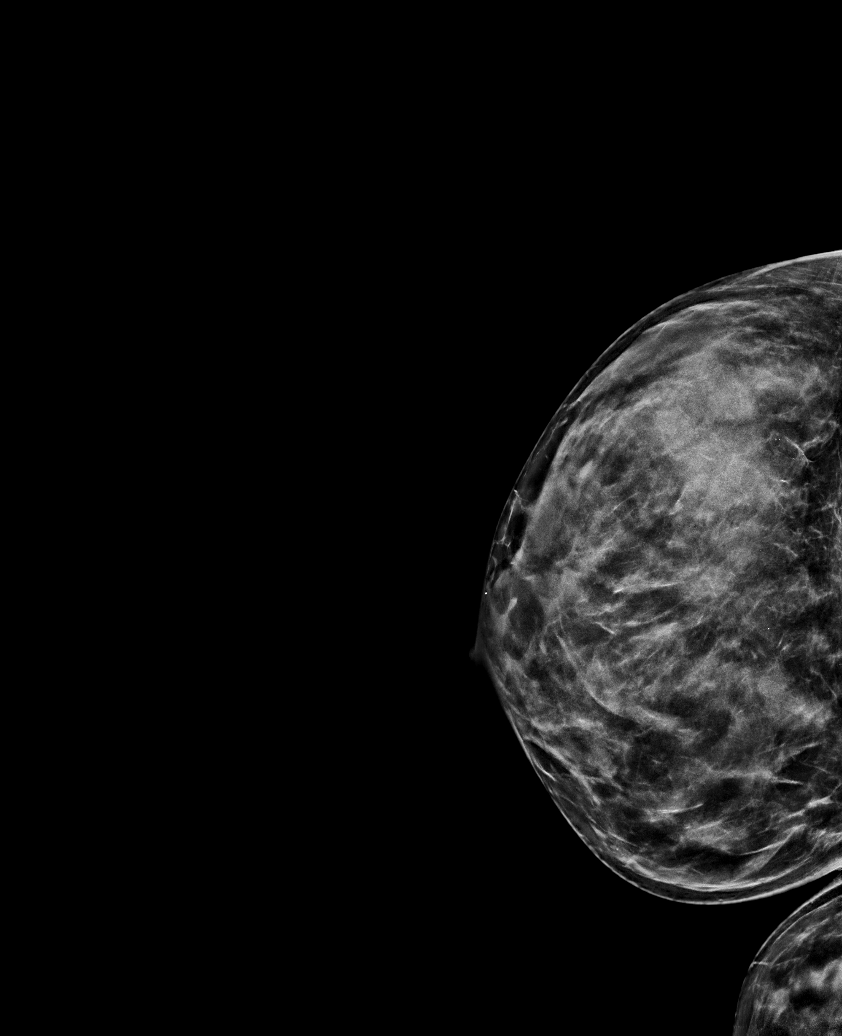

[R CC tomo (1 of 2) · tomo slice 31/61.0]
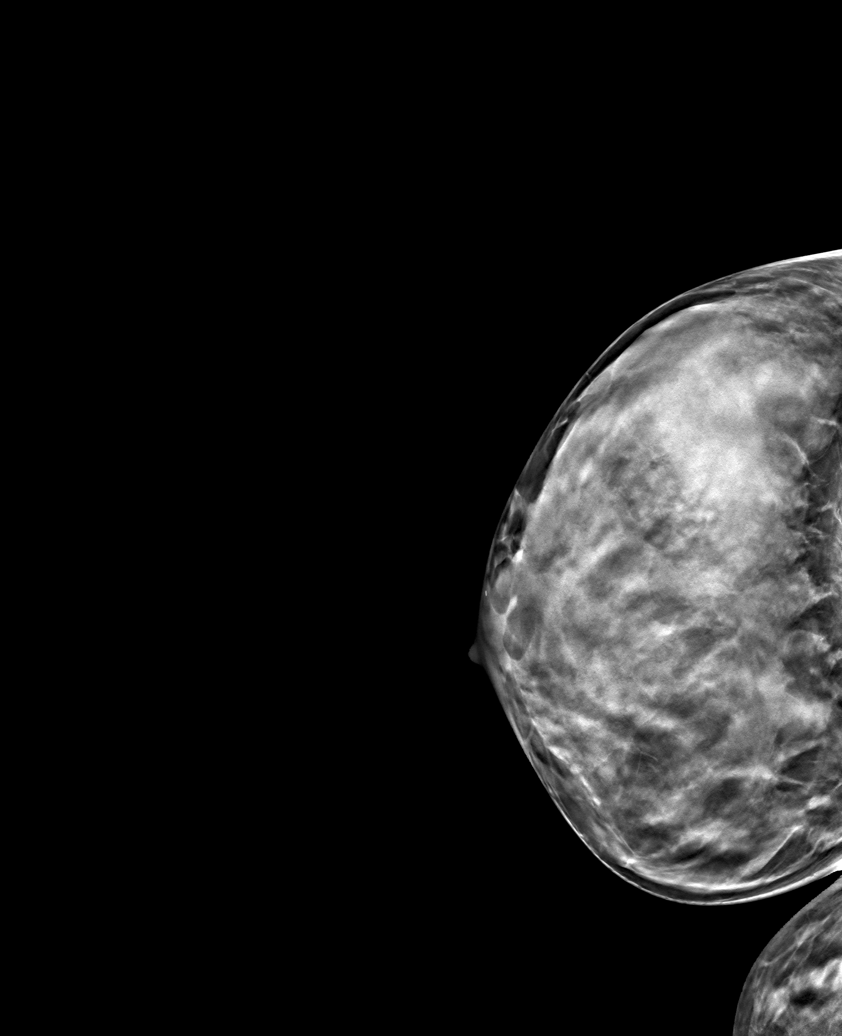

[R CC tomo (2 of 2) · tomo slice 32/63.0]
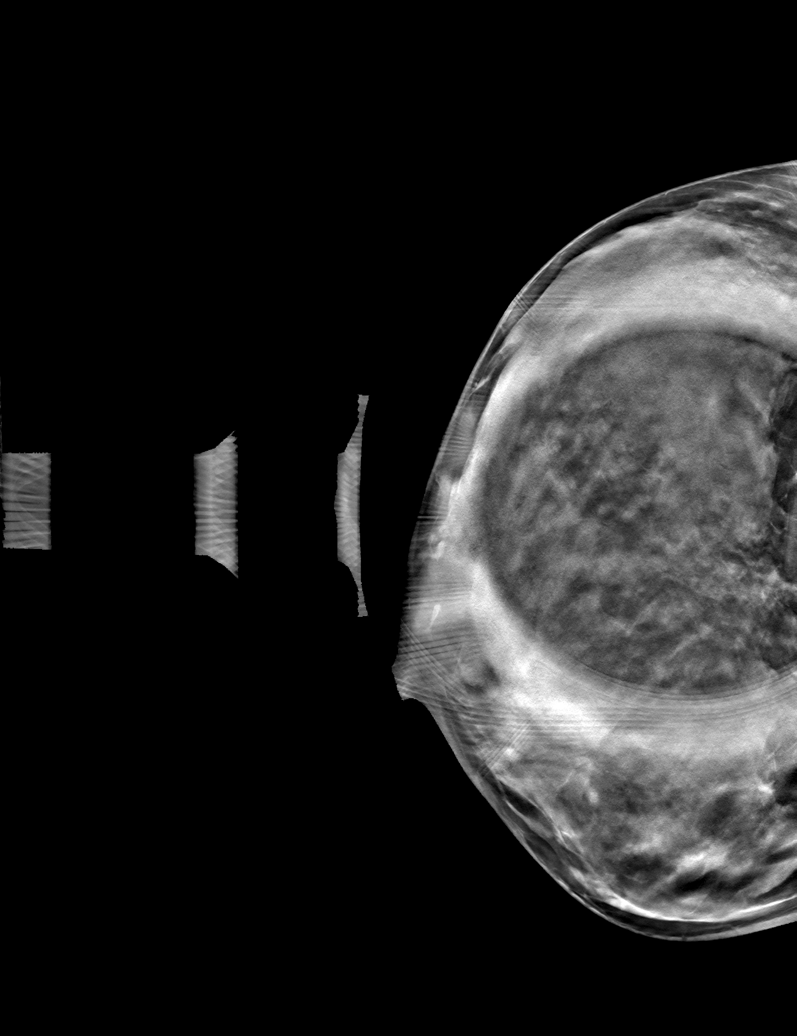

[R ML tomo · tomo slice 33/64.0]
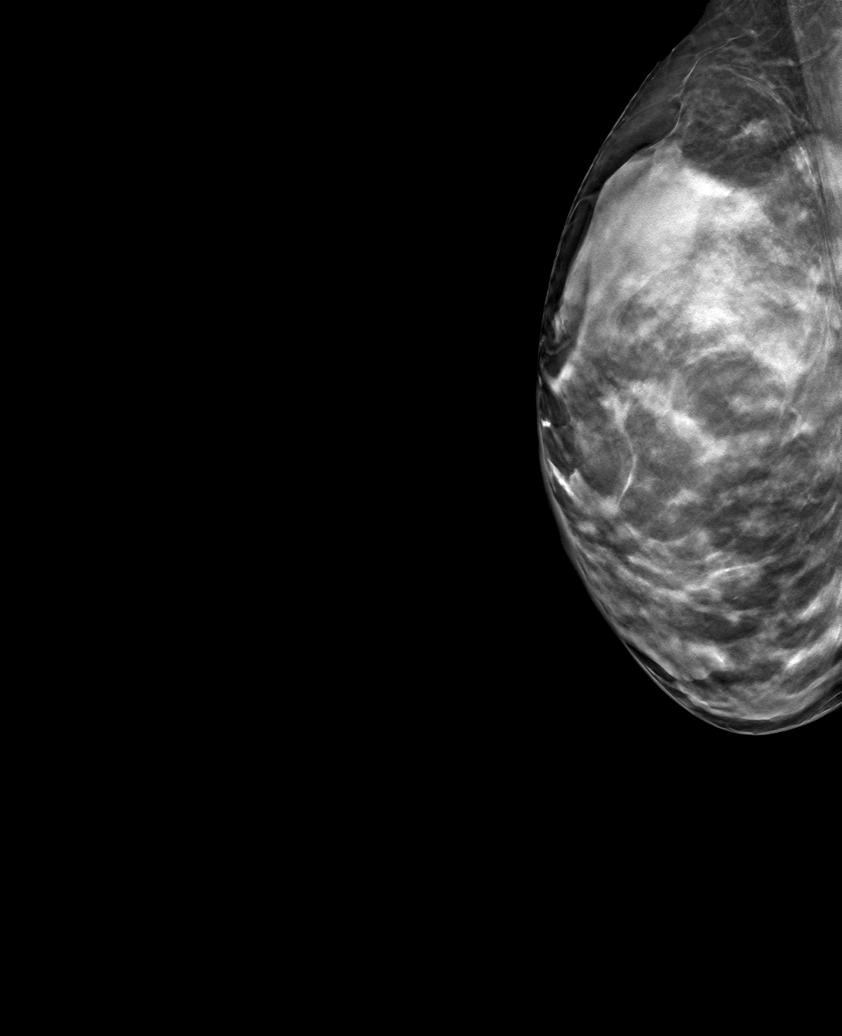

[6 of 18 positions shown; findings below may reference images not displayed]

ACR Breast Density Category d: The breast tissue is extremely dense,
which lowers the sensitivity of mammography.
FINDINGS: The previously described finding does not persist with additional
views, consistent with superimposed fibroglandular tissue.
Fibroglandular tissue assumes a configuration stable in comparison
to prior mammogram from 4444. No suspicious mass,
microcalcification, or other finding is identified. Due to breast
density, targeted ultrasound was performed.

On physical exam, no suspicious mass is appreciated.

Targeted ultrasound was performed of the RIGHT upper breast. No
suspicious cystic or solid mass is seen at the site of screening
mammographic concern. Dense fibroglandular tissue is noted.
IMPRESSION: No mammographic or sonographic evidence of malignancy at the site of
screening mammographic concern.

RECOMMENDATION:
Screening mammogram in one year.(Code:[id number]-J-54Y)

Given extreme breast density, supplemental annual screening with
screening breast ultrasound or screening breast MRI may be
clinically indicated.

The American Cancer Society recommends annual MRI and mammography in
patients with an estimated lifetime risk of developing breast cancer
greater than 20 - 25%, or who are known or suspected to be positive
for the breast cancer gene.

I have discussed the findings and recommendations with the patient.
If applicable, a reminder letter will be sent to the patient
regarding the next appointment.

BI-RADS CATEGORY  1: Negative.
